# Patient Record
Sex: Male | Born: 1975 | Race: White | Hispanic: No | Marital: Married | Smoking: Former smoker
Health system: Southern US, Community
[De-identification: ages and names within clinical notes are randomized; demographics above are authoritative.]

## PROBLEM LIST (undated history)

## (undated) DIAGNOSIS — G8929 Other chronic pain: Secondary | ICD-10-CM

## (undated) DIAGNOSIS — N281 Cyst of kidney, acquired: Secondary | ICD-10-CM

## (undated) DIAGNOSIS — I2510 Atherosclerotic heart disease of native coronary artery without angina pectoris: Secondary | ICD-10-CM

## (undated) DIAGNOSIS — F429 Obsessive-compulsive disorder, unspecified: Secondary | ICD-10-CM

## (undated) DIAGNOSIS — F419 Anxiety disorder, unspecified: Secondary | ICD-10-CM

## (undated) DIAGNOSIS — J939 Pneumothorax, unspecified: Secondary | ICD-10-CM

## (undated) DIAGNOSIS — M545 Low back pain, unspecified: Secondary | ICD-10-CM

## (undated) DIAGNOSIS — M1990 Unspecified osteoarthritis, unspecified site: Secondary | ICD-10-CM

## (undated) HISTORY — PX: OTHER SURGICAL HISTORY: SHX169

## (undated) HISTORY — DX: Anxiety disorder, unspecified: F41.9

## (undated) HISTORY — DX: Obsessive-compulsive disorder, unspecified: F42.9

## (undated) HISTORY — PX: EYE SURGERY: SHX253

---

## 2016-04-19 ENCOUNTER — Ambulatory Visit: Payer: Self-pay | Admitting: Family Medicine

## 2017-04-22 ENCOUNTER — Emergency Department
Admission: EM | Admit: 2017-04-22 | Discharge: 2017-04-22 | Disposition: A | Discharge status: Home / Self Care | Payer: Self-pay | Attending: Emergency Medicine | Admitting: Emergency Medicine

## 2017-04-22 ENCOUNTER — Other Ambulatory Visit: Payer: Self-pay

## 2017-04-22 DIAGNOSIS — S0501XA Injury of conjunctiva and corneal abrasion without foreign body, right eye, initial encounter: Secondary | ICD-10-CM | POA: Insufficient documentation

## 2017-04-22 DIAGNOSIS — Y998 Other external cause status: Secondary | ICD-10-CM | POA: Insufficient documentation

## 2017-04-22 DIAGNOSIS — F17210 Nicotine dependence, cigarettes, uncomplicated: Secondary | ICD-10-CM | POA: Insufficient documentation

## 2017-04-22 DIAGNOSIS — Y9389 Activity, other specified: Secondary | ICD-10-CM | POA: Insufficient documentation

## 2017-04-22 DIAGNOSIS — Y929 Unspecified place or not applicable: Secondary | ICD-10-CM | POA: Insufficient documentation

## 2017-04-22 DIAGNOSIS — W51XXXA Accidental striking against or bumped into by another person, initial encounter: Secondary | ICD-10-CM | POA: Insufficient documentation

## 2017-04-22 MED ORDER — FLUORESCEIN SODIUM 1 MG OP STRP
1.0000 | ORAL_STRIP | Freq: Once | OPHTHALMIC | Status: DC
  Filled 2017-04-22: qty 1

## 2017-04-22 MED ORDER — KETOROLAC TROMETHAMINE 0.5 % OP SOLN: 1.0000 [drp] | Freq: Four times a day (QID) | OPHTHALMIC | 0 refills | Status: DC

## 2017-04-22 MED ORDER — POLYMYXIN B-TRIMETHOPRIM 10000-0.1 UNIT/ML-% OP SOLN: 2.0000 [drp] | Freq: Four times a day (QID) | OPHTHALMIC | 0 refills | Status: DC

## 2017-04-22 MED ORDER — TETRACAINE HCL 0.5 % OP SOLN
2.0000 [drp] | Freq: Once | OPHTHALMIC | Status: DC
  Filled 2017-04-22: qty 4

## 2017-04-22 NOTE — ED Notes (Signed)
First nurse note: pt complains of "son scratching right eye" about an hour ago. Pt with redness and tearing to r eye.

## 2017-04-22 NOTE — ED Notes (Signed)
Visual acuity: Bilateral: 20/40 Left: 20/40 Right: 20/50

## 2017-04-22 NOTE — ED Provider Notes (Signed)
[facility] Emergency Department Provider Note  ____________________________________________  Time seen: Approximately 11:34 PM  I have reviewed the triage vital signs and the nursing notes.   HISTORY  Chief Complaint Eye Pain    HPI Ralph Lawson is a 42 y.o. male who presents the emergency department complaining of right eye pain.  Patient reports that he was playing with his son when he was accidentally poked in his right eye.  Patient tried over-the-counter eyedrops without any improvement in symptoms.  Patient denies any vision changes, just a foreign body sensation to the right eye.  Patient does not wear glasses or contacts.  Other than over-the-counter eyedrops, no medication for this complaint.  No other complaints at this time.  History reviewed. No pertinent past medical history.  There are no active problems to display for this patient.   Past Surgical History:  Procedure Laterality Date  . collapsed lung      Prior to Admission medications   Medication Sig Start Date End Date Taking? Authorizing Provider  ketorolac (ACULAR) 0.5 % ophthalmic solution Place 1 drop into the right eye 4 (four) times daily. 04/22/17   Cuthriell, Charline Bills, PA-C  trimethoprim-polymyxin b (POLYTRIM) ophthalmic solution Place 2 drops into the right eye every 6 (six) hours. 04/22/17   Cuthriell, Charline Bills, PA-C    Allergies Patient has no known allergies.  No family history on file.  Social History Social History   Tobacco Use  . Smoking status: Current Some Day Smoker    Types: E-cigarettes  . Smokeless tobacco: Never Used  Substance Use Topics  . Alcohol use: Not on file  . Drug use: Not on file     Review of Systems  Constitutional: No fever/chills Eyes: No visual changes. No discharge.  Injury and pain to the right eye. ENT: No upper respiratory complaints. Cardiovascular: no chest pain. Respiratory: no cough. No SOB. Gastrointestinal: No abdominal  pain.  No nausea, no vomiting. Musculoskeletal: Negative for musculoskeletal pain. Skin: Negative for rash, abrasions, lacerations, ecchymosis. Neurological: Negative for headaches, focal weakness or numbness. 10-point ROS otherwise negative.  ____________________________________________   PHYSICAL EXAM:  VITAL SIGNS: ED Triage Vitals  Enc Vitals Group     BP 04/22/17 2044 130/83     Pulse Rate 04/22/17 2044 92     Resp 04/22/17 2044 18     Temp 04/22/17 2044 98.7 F (37.1 C)     Temp Source 04/22/17 2044 Oral     SpO2 04/22/17 2044 99 %     Weight 04/22/17 2045 175 lb (79.4 kg)     Height 04/22/17 2045 6\' 1"  (1.854 m)     Head Circumference --      Peak Flow --      Pain Score 04/22/17 2045 2     Pain Loc --      Pain Edu? --      Excl. in [place]? --      Constitutional: Alert and oriented. Well appearing and in no acute distress. Eyes: Conjunctivae are normal. PERRL. EOMI. funduscopic exam reveals red reflex, vasculature and optic disc bilaterally.  No visible injury or foreign body on funduscopic exam.  Eyes anesthetized using tetracaine drops.  Forcing staining is applied with area of uptake over the superior iris.  No foreign body. Head: Atraumatic. ENT:      Ears:       Nose: No congestion/rhinnorhea.      Mouth/Throat: Mucous membranes are moist.  Neck: No stridor.  Cardiovascular: Normal rate, regular rhythm. Normal S1 and S2.  Good peripheral circulation. Respiratory: Normal respiratory effort without tachypnea or retractions. Lungs CTAB. Good air entry to the bases with no decreased or absent breath sounds. Musculoskeletal: Full range of motion to all extremities. No gross deformities appreciated. Neurologic:  Normal speech and language. No gross focal neurologic deficits are appreciated.  Skin:  Skin is warm, dry and intact. No rash noted. Psychiatric: Mood and affect are normal. Speech and behavior are normal. Patient exhibits appropriate insight and  judgement.   ____________________________________________   LABS (all labs ordered are listed, but only abnormal results are displayed)  Labs Reviewed - No data to display ____________________________________________  EKG   ____________________________________________  RADIOLOGY   No results found.  ____________________________________________    PROCEDURES  Procedure(s) performed:    Procedures    Medications  tetracaine (PONTOCAINE) 0.5 % ophthalmic solution 2 drop (has no administration in time range)  fluorescein ophthalmic strip 1 strip (has no administration in time range)     ____________________________________________   INITIAL IMPRESSION / ASSESSMENT AND PLAN / ED COURSE  Pertinent labs & imaging results that were available during my care of the patient were reviewed by me and considered in my medical decision making (see chart for details).  Review of the [place] CSRS was performed in accordance of the [place] prior to dispensing any controlled drugs.     Patient's diagnosis is consistent with corneal abrasion.  Patient presents emergency department status post injury to the right eye.  Patient reports he was accidentally poked in the right eye with a finger.  No visual changes.  Exam reveals area of uptake consistent with corneal abrasion.. Patient will be discharged home with prescriptions for biotic eyedrops and Acular for symptom control. Patient is to follow up with ophthalmology as needed or otherwise directed. Patient is given ED precautions to return to the ED for any worsening or new symptoms.     ____________________________________________  FINAL CLINICAL IMPRESSION(S) / ED DIAGNOSES  Final diagnoses:  Abrasion of right cornea, initial encounter      NEW MEDICATIONS STARTED DURING THIS VISIT:  ED Discharge Orders        Ordered    trimethoprim-polymyxin b (POLYTRIM) ophthalmic solution  Every 6 hours     04/22/17 2158    ketorolac  (ACULAR) 0.5 % ophthalmic solution  4 times daily     04/22/17 2158          This chart was dictated using voice recognition software/Dragon. Despite best efforts to proofread, errors can occur which can change the meaning. Any change was purely unintentional.    Darletta Moll, PA-C 04/22/17 2336    Lavonia Drafts, MD 04/22/17 2337

## 2017-04-22 NOTE — ED Triage Notes (Signed)
Pt arrives to ED via POV from home with c/o RIGHT eye pain s/p being poked in the eye by his son 2 hrs PTA. Pt denies use of corrective lenses or contacts. Pt is A&O, in NAD; RR even, regular, and unlabored.

## 2018-01-16 ENCOUNTER — Encounter: Payer: Self-pay | Admitting: Medical Oncology

## 2018-01-16 ENCOUNTER — Emergency Department
Admission: EM | Admit: 2018-01-16 | Discharge: 2018-01-16 | Disposition: A | Discharge status: Home / Self Care | Payer: Self-pay | Attending: Emergency Medicine | Admitting: Emergency Medicine

## 2018-01-16 DIAGNOSIS — F17290 Nicotine dependence, other tobacco product, uncomplicated: Secondary | ICD-10-CM | POA: Insufficient documentation

## 2018-01-16 DIAGNOSIS — H44702 Unspecified retained (old) intraocular foreign body, nonmagnetic, left eye: Secondary | ICD-10-CM | POA: Insufficient documentation

## 2018-01-16 DIAGNOSIS — Z79899 Other long term (current) drug therapy: Secondary | ICD-10-CM | POA: Insufficient documentation

## 2018-01-16 DIAGNOSIS — Z189 Retained foreign body fragments, unspecified material: Secondary | ICD-10-CM

## 2018-01-16 DIAGNOSIS — Y939 Activity, unspecified: Secondary | ICD-10-CM | POA: Insufficient documentation

## 2018-01-16 DIAGNOSIS — W208XXA Other cause of strike by thrown, projected or falling object, initial encounter: Secondary | ICD-10-CM | POA: Insufficient documentation

## 2018-01-16 DIAGNOSIS — Y929 Unspecified place or not applicable: Secondary | ICD-10-CM | POA: Insufficient documentation

## 2018-01-16 DIAGNOSIS — Y990 Civilian activity done for income or pay: Secondary | ICD-10-CM | POA: Insufficient documentation

## 2018-01-16 DIAGNOSIS — Z182 Retained plastic fragments: Secondary | ICD-10-CM | POA: Insufficient documentation

## 2018-01-16 MED ORDER — FLUORESCEIN SODIUM 1 MG OP STRP
1.0000 | ORAL_STRIP | Freq: Once | OPHTHALMIC | Status: AC
  Administered 2018-01-16: 1 via OPHTHALMIC
  Filled 2018-01-16: qty 1

## 2018-01-16 MED ORDER — TETRACAINE HCL 0.5 % OP SOLN
2.0000 [drp] | Freq: Once | OPHTHALMIC | Status: AC
  Administered 2018-01-16: 2 [drp] via OPHTHALMIC
  Filled 2018-01-16: qty 4

## 2018-01-16 MED ORDER — ERYTHROMYCIN 5 MG/GM OP OINT: 1.0000 "application " | TOPICAL_OINTMENT | Freq: Four times a day (QID) | OPHTHALMIC | 0 refills | Status: AC

## 2018-01-16 NOTE — Discharge Instructions (Addendum)
Please call Dr. Wallace Going for an appointment. Use the eye ointment as prescribed as well as the pain relieving drop. Return to the ER for symptoms of concern if unable to schedule an appointment.

## 2018-01-16 NOTE — ED Notes (Signed)
See triage note  States he thinks something flew into left eye at work

## 2018-01-16 NOTE — ED Notes (Signed)
Reviewed discharge instructions, follow-up care, and prescriptions with patient. Patient verbalized understanding of all information reviewed. Patient stable, with no distress noted at this time.    

## 2018-01-16 NOTE — ED Provider Notes (Signed)
[facility] Emergency Department Provider Note ____________________________________________  Time seen: Approximately 6:52 PM  I have reviewed the triage vital signs and the nursing notes.   HISTORY  Chief Complaint Foreign Body in Eye   HPI Ralph Lawson is a 43 y.o. male who presents to the emergency department for treatment and evaluation of left eye pain. Plastic flew into his eye last night at work. He tried Acular and Polytrim that he had left from a previous foreign body in the eye. No relief.   History reviewed. No pertinent past medical history.  There are no active problems to display for this patient.   Past Surgical History:  Procedure Laterality Date  . collapsed lung      Prior to Admission medications   Medication Sig Start Date End Date Taking? Authorizing Provider  erythromycin ophthalmic ointment Place 1 application into the left eye 4 (four) times daily for 7 days. 01/16/18 01/23/18  Govind Furey, Dessa Phi, FNP  ketorolac (ACULAR) 0.5 % ophthalmic solution Place 1 drop into the right eye 4 (four) times daily. 04/22/17   Cuthriell, Charline Bills, PA-C  trimethoprim-polymyxin b (POLYTRIM) ophthalmic solution Place 2 drops into the right eye every 6 (six) hours. 04/22/17   Cuthriell, Charline Bills, PA-C    Allergies Patient has no known allergies.  No family history on file.  Social History Social History   Tobacco Use  . Smoking status: Current Some Day Smoker    Types: E-cigarettes  . Smokeless tobacco: Never Used  Substance Use Topics  . Alcohol use: Not on file  . Drug use: Not on file    Review of Systems   Constitutional: No fever/chills Eyes: Positive for visual changes. Positive for pain. Negative for drainage. Musculoskeletal: Negative for pain. Skin: Negative for rash. Neurological: Negative for headaches, focal weakness or numbness. Allergic: Negative for seasonal  allergies. ____________________________________________  PHYSICAL EXAM:  VITAL SIGNS: ED Triage Vitals  Enc Vitals Group     BP 01/16/18 1818 134/79     Pulse Rate 01/16/18 1818 74     Resp 01/16/18 1818 18     Temp 01/16/18 1818 98.4 F (36.9 C)     Temp Source 01/16/18 1818 Oral     SpO2 01/16/18 1818 99 %     Weight 01/16/18 1751 174 lb 2.6 oz (79 kg)     Height --      Head Circumference --      Peak Flow --      Pain Score 01/16/18 1751 4     Pain Loc --      Pain Edu? --      Excl. in [place]? --     Constitutional: Alert and oriented. Well appearing and in no acute distress. Eyes: Visual acuity--see nursing documentation; no globe trauma; Eyelids normal to inspection; Sclera appears anicteric.  Eyelids were inverted. Conjunctiva appears injected and erythematous with a gray-colored foreign body above the iris at approximately 1 o'clock position.  Foreign body measures approximately 2 mm; Cornea intact. Head: Atraumatic. Nose: No congestion/rhinnorhea. Mouth/Throat: Mucous membranes are moist.  Oropharynx non-erythematous. Respiratory: Respirations even and unlabored. Breath sounds clear to auscultation. Musculoskeletal:Normal ROM x 4 extremities. Neurologic:  Normal speech and language. No gross focal neurologic deficits are appreciated. Speech is normal. No gait instability. Skin:  Skin is warm, dry and intact. No rash noted. Psychiatric: Mood and affect are normal. Speech and behavior are normal.  ____________________________________________   LABS (all labs ordered are listed, but only  abnormal results are displayed)  Labs Reviewed - No data to display ____________________________________________  EKG  Not indicated ____________________________________________  RADIOLOGY  Not indicated ____________________________________________   PROCEDURES  Procedure(s) performed: None ____________________________________________   INITIAL IMPRESSION / ASSESSMENT  AND PLAN / ED COURSE  43 year old male presenting to the emergency department for treatment and evaluation of left eye pain.  He states that he believes a piece of plastic flew into his eye last night.  He went to sleep and upon awakening this morning the eye was very irritated and has continued to be so throughout the day.  He has used some medications that were previously prescribed for corneal abrasion without any relief.  An attempt was made to remove the plastic foreign body from the eye without success.  He will be covered with erythromycin ointment and advised to continue using the Acular.  He is to call tomorrow and schedule an appointment with [facility].  He was advised to return to the emergency department for symptoms change or worsen if unable to schedule an appointment.  Pertinent labs & imaging results that were available during my care of the patient were reviewed by me and considered in my medical decision making (see chart for details). ____________________________________________   FINAL CLINICAL IMPRESSION(S) / ED DIAGNOSES  Final diagnoses:  Retained foreign body of left eye    Note:  This document was prepared using Dragon voice recognition software and may include unintentional dictation errors.    Victorino Dike, FNP 01/17/18 6010    Arta Silence, MD 01/17/18 1506

## 2018-01-16 NOTE — ED Triage Notes (Signed)
Pt reports that he was at work and a piece of plastic flew into his left eye.

## 2020-05-20 ENCOUNTER — Ambulatory Visit: Payer: Self-pay | Admitting: *Deleted

## 2020-05-20 ENCOUNTER — Emergency Department
Admission: EM | Admit: 2020-05-20 | Discharge: 2020-05-20 | Disposition: A | Discharge status: Home / Self Care | Payer: BC Managed Care – PPO | Attending: Emergency Medicine | Admitting: Emergency Medicine

## 2020-05-20 ENCOUNTER — Encounter: Payer: Self-pay | Admitting: Emergency Medicine

## 2020-05-20 ENCOUNTER — Other Ambulatory Visit: Payer: Self-pay

## 2020-05-20 ENCOUNTER — Emergency Department: Payer: BC Managed Care – PPO

## 2020-05-20 DIAGNOSIS — F17290 Nicotine dependence, other tobacco product, uncomplicated: Secondary | ICD-10-CM | POA: Diagnosis not present

## 2020-05-20 DIAGNOSIS — M7711 Lateral epicondylitis, right elbow: Secondary | ICD-10-CM | POA: Diagnosis not present

## 2020-05-20 DIAGNOSIS — X500XXA Overexertion from strenuous movement or load, initial encounter: Secondary | ICD-10-CM | POA: Diagnosis not present

## 2020-05-20 DIAGNOSIS — M25521 Pain in right elbow: Secondary | ICD-10-CM | POA: Diagnosis not present

## 2020-05-20 MED ORDER — METHYLPREDNISOLONE 4 MG PO TBPK: ORAL_TABLET | ORAL | 0 refills | Status: DC

## 2020-05-20 MED ORDER — ORPHENADRINE CITRATE ER 100 MG PO TB12: 100.0000 mg | ORAL_TABLET | Freq: Two times a day (BID) | ORAL | 0 refills | Status: DC

## 2020-05-20 NOTE — ED Triage Notes (Signed)
Pt comes with c/o right wrist and pain traveling up his arm. Pt states two days ago he was lifting something and heard a pop/ pt has noticeable swelling to wrist area.

## 2020-05-20 NOTE — Telephone Encounter (Signed)
Patient is calling to report he has injured his elbow- 2 days ago he heard a pop and his elbow has developed weakness, pain and swelling. Advised UC/ED for evaluation. Patient has NP appointment 5/20  Reason for Disposition . Can't move injured elbow normally (i.e., bend or straighten completely)  Answer Assessment - Initial Assessment Questions 1. MECHANISM: "How did the injury happen?"     R elbow- outside of joint- heard pop while working 2. ONSET: "When did the injury happen?" (Minutes or hours ago)      2 days ago 3. LOCATION: "What part of the elbow is the injured?"      outside of the joint 4. APPEARANCE of INJURY: "What does the injury look like?"      puffy 5. SEVERITY: "Can you use the elbow normally?"  "Can you bend it and straighten it fully?"     No- painful- normal movement- with pain 6. SIZE: For cuts, bruises, or swelling, ask: "How large is it?" (e.g., inches or centimeters; entire joint)      Swelling, discoloration 7. PAIN: "Is there pain?" If Yes, ask: "How bad is the pain?"    (Scale 1-10; or mild, moderate, severe)   - NONE (0): no pain.   - MILD (1-3): doesn't interfere with normal activities.   - MODERATE (4-7): interferes with normal activities (e.g., work or school) or awakens from sleep.   - SEVERE (8-10): excruciating pain, unable to do any normal activities, unable to use arm at all.     Yes-severe with pressure to pick up 8. TETANUS: For any breaks in the skin, ask: "When was the last tetanus booster?"     n/a 9. OTHER SYMPTOMS: "Do you have any other symptoms?"  (e.g., numbness in hand)     Shoulder is affected 10. PREGNANCY: "Is there any chance you are pregnant?" "When was your last menstrual period?"       n/a  Protocols used: ELBOW INJURY-A-AH

## 2020-05-20 NOTE — Discharge Instructions (Signed)
Read and follow discharge care instruction.  Purchase an over-the-counter elbow strap.  Take medication as directed.  Follow-up with orthopedics if no improvement in 1 week.

## 2020-05-20 NOTE — ED Notes (Signed)
See triage note  Presents with pain to right elbow area  States he was lifting something couple of days ago  [place] a pop   Had some pain with some swelling to right wrist  Now states pain is moving up into elbow area  Good pulses

## 2020-05-20 NOTE — ED Provider Notes (Signed)
[facility] Provider Note   ____________________________________________   Event Date/Time   First MD Initiated Contact with Patient 05/20/20 [phone]     (approximate)  I have reviewed the triage vital signs and the nursing notes.   HISTORY  Chief Complaint Wrist Pain    HPI Ralph Lawson is a 45 y.o. male patient complaining of right lateral elbow pain for 2 days status post lifting incident.  Patient states he heard a "pop in his elbow".  Patient states since he has been pain increased with pronation and supination.  Also has increased pain with flexion.  Patient is right-hand dominant.  Patient rates the pain as a constant 4/10.  Patient states pain increases to a 7/10 with movement.         History reviewed. No pertinent past medical history.  There are no problems to display for this patient.   Past Surgical History:  Procedure Laterality Date  . collapsed lung      Prior to Admission medications   Medication Sig Start Date End Date Taking? Authorizing Provider  methylPREDNISolone (MEDROL DOSEPAK) 4 MG TBPK tablet Take Tapered dose as directed 05/20/20  Yes Sable Feil, PA-C  orphenadrine (NORFLEX) 100 MG tablet Take 1 tablet (100 mg total) by mouth 2 (two) times daily. 05/20/20  Yes Sable Feil, PA-C    Allergies Patient has no known allergies.  No family history on file.  Social History Social History   Tobacco Use  . Smoking status: Current Some Day Smoker    Types: E-cigarettes  . Smokeless tobacco: Never Used    Review of Systems  Constitutional: No fever/chills Eyes: No visual changes. ENT: No sore throat. Cardiovascular: Denies chest pain. Respiratory: Denies shortness of breath. Gastrointestinal: No abdominal pain.  No nausea, no vomiting.  No diarrhea.  No constipation. Genitourinary: Negative for dysuria. Musculoskeletal right elbow pain.   Skin: Negative for rash. Neurological: Negative  for headaches, focal weakness or numbness.   ____________________________________________   PHYSICAL EXAM:  VITAL SIGNS: ED Triage Vitals  Enc Vitals Group     BP 05/20/20 0949 (!) 142/101     Pulse Rate 05/20/20 0949 (!) 107     Resp --      Temp 05/20/20 0949 97.9 F (36.6 C)     Temp src --      SpO2 05/20/20 0949 100 %     Weight 05/20/20 0947 185 lb (83.9 kg)     Height 05/20/20 0947 6\' 1"  (1.854 m)     Head Circumference --      Peak Flow --      Pain Score 05/20/20 0948 4     Pain Loc --      Pain Edu? --      Excl. in [place]? --    Constitutional: Alert and oriented. Well appearing and in no acute distress. Cardiovascular: Normal rate, regular rhythm. Grossly normal heart sounds.  Good peripheral circulation. Respiratory: Normal respiratory effort.  No retractions. Lungs CTAB. Musculoskeletal: No obvious deformity to the right elbow.  Patient has full and equal range of motion to grimace of pain.Marland Kitchen Neurologic:  Normal speech and language. No gross focal neurologic deficits are appreciated. No gait instability. Skin:  Skin is warm, dry and intact. No rash noted. Psychiatric: Mood and affect are normal. Speech and behavior are normal.  ____________________________________________   LABS (all labs ordered are listed, but only abnormal results are displayed)  Labs Reviewed - No data  to display ____________________________________________  EKG   ____________________________________________  RADIOLOGY I, Sable Feil, personally viewed and evaluated these images (plain radiographs) as part of my medical decision making, as well as reviewing the written report by the radiologist.  ED MD interpretation: No acute bony abnormality found on x-ray of the right elbow.  Official radiology report(s): DG Elbow Complete Right  Result Date: 05/20/2020 CLINICAL DATA:  Right elbow pain status post lifting incident EXAM: RIGHT ELBOW - COMPLETE 3+ VIEW COMPARISON:  None.  FINDINGS: There is no evidence of fracture, dislocation, or joint effusion. There is no evidence of arthropathy or other focal bone abnormality. Soft tissues are unremarkable. IMPRESSION: Negative. Electronically Signed   By: Miachel Roux M.D.   On: 05/20/2020 10:44    ____________________________________________   PROCEDURES  Procedure(s) performed (including Critical Care):  Procedures   ____________________________________________   INITIAL IMPRESSION / ASSESSMENT AND PLAN / ED COURSE  As part of my medical decision making, I reviewed the following data within the [place]         Patient presents with 2 days of increasing right lateral elbow pain.  Discussed no acute findings on x-ray of the right elbow.  Patient complaining physical exam consistent with lateral right epicondylitis.  Patient given discharge care instructions and advised take medication as directed.  Advised if no improvement in 1 week to follow-up orthopedics.      ____________________________________________   FINAL CLINICAL IMPRESSION(S) / ED DIAGNOSES  Final diagnoses:  Epicondylitis, lateral, right     ED Discharge Orders         Ordered    methylPREDNISolone (MEDROL DOSEPAK) 4 MG TBPK tablet        05/20/20 1106    orphenadrine (NORFLEX) 100 MG tablet  2 times daily        05/20/20 1106          *Please note:  [facility] was evaluated in Emergency Department on 05/20/2020 for the symptoms described in the history of present illness. He was evaluated in the context of the global COVID-19 pandemic, which necessitated consideration that the patient might be at risk for infection with the SARS-CoV-2 virus that causes COVID-19. Institutional protocols and algorithms that pertain to the evaluation of patients at risk for COVID-19 are in a state of rapid change based on information released by regulatory bodies including the CDC and federal and state organizations. These policies  and algorithms were followed during the patient's care in the ED.  Some ED evaluations and interventions may be delayed as a result of limited staffing during and the pandemic.*   Note:  This document was prepared using Dragon voice recognition software and may include unintentional dictation errors.    Sable Feil, PA-C 05/20/20 1111    Vladimir Crofts, MD 05/20/20 [phone]

## 2020-05-22 ENCOUNTER — Telehealth: Payer: Self-pay

## 2020-05-22 NOTE — Telephone Encounter (Signed)
Recommend patient go to [facility] in [place] for evaluation.

## 2020-05-22 NOTE — Telephone Encounter (Signed)
Called patient no answer left detailed message with provider advise.

## 2020-05-22 NOTE — Telephone Encounter (Signed)
Please see triage note

## 2020-05-22 NOTE — Telephone Encounter (Signed)
Copied from [facility] [id number]. Topic: Appointment Scheduling - Scheduling Inquiry for Clinic >> May 20, 2020  8:33 AM Oneta Rack wrote: Reason for CRM, Any cancellations for a New Patient prior to 05/29/2020 please reach out to the patient. Patient has [employer] option and was sent to nurse triage due to loss of movement in right arm

## 2020-05-28 NOTE — Progress Notes (Signed)
BP (!) 142/82   Pulse (!) 101   Temp 98.4 F (36.9 C)   Ht 6' 1.58" (1.869 m)   Wt 184 lb (83.5 kg)   SpO2 99%   BMI 23.89 kg/m    Subjective:    Patient ID: Ralph Lawson, male    DOB: 02-14-1975, 45 y.o.   MRN: [id number]  HPI: Ralph Lawson is a 45 y.o. male  Chief Complaint  Patient presents with  . Establish Care  . Nicotine Dependence  . OCD    Was on Citalopram in the past with klonopin  . Arm Pain    Right elbow  . Foot Pain    Left foot, front pad going out toward toes   Patient presents to clinic to establish care with new PCP.  Patient reports a history of OCD, Current everyday smoker, anxiety, uses marijuana and drinks about 24 cans of beer daily, and had a pneumothorax around 1999.  Patient denies a history of: Hypertension, Elevated Cholesterol, Thyroid problems, Depression, Neurological problems, and Abdominal problems.   Patient has had an eye surgery many years ago.    RIGHT ARM PAIN Patient states he went to the hospital for right elbow pain.  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 He felt something hurt in his arm.  Then when he went to pick his son up he noticed the pain again and since then the pain has been worse.  He has more limited rotation than normal.  States he couldn't turn the screw driver.   LEFT FOOT PAIN Patient states he is having pain from the base of his toes to the tips of his toes.  It has been going on for about 1 year.  Patient states that he can walk on the treadmill for about 3 miles and doesn't have pain.  Patient states it feels more like a nerve pain then a muscle pain.  Patient states he has pain when he walks around at home.  Hurts in different shoes.    SMOKING CESSATION Smoking Status: current everyday smoker Smoking Amount: 1PPD Smoking Onset: 45 years old Smoking Quit Date: Unknown Smoking triggers:  Type of tobacco use: cigarettes  Children in the house: yes Other household members who smoke: no Treatments attempted: Wellbutrin,  patches Pneumovax:   OCD Patient has been on Citalopram in the past which worked well for him.  Would like to go back on the Citalopram.  Has not been on medication in several years.    Relevant past medical, surgical, family and social history reviewed and updated as indicated. Interim medical history since our last visit reviewed. Allergies and medications reviewed and updated.  Review of Systems  Eyes: Negative for visual disturbance.  Respiratory: Negative for shortness of breath.   Cardiovascular: Negative for chest pain and leg swelling.  Musculoskeletal:       Right elbow pain.  Left foot pain.  Neurological: Negative for light-headedness and headaches.    Per HPI unless specifically indicated above     Objective:    BP (!) 142/82   Pulse (!) 101   Temp 98.4 F (36.9 C)   Ht 6' 1.58" (1.869 m)   Wt 184 lb (83.5 kg)   SpO2 99%   BMI 23.89 kg/m   Wt Readings from Last 3 Encounters:  05/29/20 184 lb (83.5 kg)  05/20/20 185 lb (83.9 kg)  01/16/18 174 lb 2.6 oz (79 kg)    Physical Exam Vitals and nursing note reviewed.  Constitutional:  General: He is not in acute distress.    Appearance: Normal appearance. He is not ill-appearing, toxic-appearing or diaphoretic.  HENT:     Head: Normocephalic.     Right Ear: External ear normal.     Left Ear: External ear normal.     Nose: Nose normal. No congestion or rhinorrhea.     Mouth/Throat:     Mouth: Mucous membranes are moist.  Eyes:     General:        Right eye: No discharge.        Left eye: No discharge.     Extraocular Movements: Extraocular movements intact.     Conjunctiva/sclera: Conjunctivae normal.     Pupils: Pupils are equal, round, and reactive to light.  Cardiovascular:     Rate and Rhythm: Normal rate and regular rhythm.     Heart sounds: No murmur heard.   Pulmonary:     Effort: Pulmonary effort is normal. No respiratory distress.     Breath sounds: Normal breath sounds. No wheezing,  rhonchi or rales.  Abdominal:     General: Abdomen is flat. Bowel sounds are normal.  Musculoskeletal:     Right elbow: No swelling, deformity, effusion or lacerations. Decreased range of motion. No tenderness.       Arms:     Cervical back: Normal range of motion and neck supple.  Skin:    General: Skin is warm and dry.     Capillary Refill: Capillary refill takes less than 2 seconds.  Neurological:     General: No focal deficit present.     Mental Status: He is alert and oriented to person, place, and time.  Psychiatric:        Mood and Affect: Mood normal.        Behavior: Behavior normal.        Thought Content: Thought content normal.        Judgment: Judgment normal.     No results found for this or any previous visit.    Assessment & Plan:   Problem List Items Addressed This Visit      Other   OCD (obsessive compulsive disorder)    Will restart Citalopram at 10mg . Patient was previously on 20mg .  Will start at low dose and titrate up as patient tolerates.  Side effects and benefits of medication discussed with patient during visit.  Follow up in 1 month for reevaluation.      Relevant Medications   citalopram (CELEXA) 10 MG tablet   Tobacco abuse    Smoking cessation discussed during visit.  Patient given Chantix.  Side effects and benefits discussed during visit.  Discussed proper use of the medication.  Pick a day next week to quit smoking. Follow up in 1 month for reevaluation.       Other Visit Diagnoses    Right elbow pain    -  Primary   Recommend evaluation with Orthopedics.    Relevant Orders   Ambulatory referral to Orthopedics   Elevated blood pressure reading       Never diagnosed with HTN. Will recheck in one month and discuss medication if necessary at that time.    Encounter to establish care       Immunization due       Relevant Orders   Tdap vaccine greater than or equal to 7yo IM (Completed)   Pneumococcal polysaccharide vaccine 23-valent  greater than or equal to 2yo subcutaneous/IM (Completed)  Follow up plan: Return in about 1 month (around 06/29/2020) for Physical and Fasting labs.

## 2020-05-29 ENCOUNTER — Encounter: Payer: Self-pay | Admitting: Nurse Practitioner

## 2020-05-29 ENCOUNTER — Other Ambulatory Visit: Payer: Self-pay

## 2020-05-29 ENCOUNTER — Ambulatory Visit: Payer: BC Managed Care – PPO | Admitting: Nurse Practitioner

## 2020-05-29 VITALS — BP 142/82 | HR 101 | Temp 98.4°F | Ht 73.58 in | Wt 184.0 lb

## 2020-05-29 DIAGNOSIS — R030 Elevated blood-pressure reading, without diagnosis of hypertension: Secondary | ICD-10-CM

## 2020-05-29 DIAGNOSIS — Z23 Encounter for immunization: Secondary | ICD-10-CM | POA: Diagnosis not present

## 2020-05-29 DIAGNOSIS — M25521 Pain in right elbow: Secondary | ICD-10-CM | POA: Diagnosis not present

## 2020-05-29 DIAGNOSIS — F429 Obsessive-compulsive disorder, unspecified: Secondary | ICD-10-CM

## 2020-05-29 DIAGNOSIS — Z720 Tobacco use: Secondary | ICD-10-CM

## 2020-05-29 DIAGNOSIS — Z7689 Persons encountering health services in other specified circumstances: Secondary | ICD-10-CM

## 2020-05-29 MED ORDER — CITALOPRAM HYDROBROMIDE 10 MG PO TABS: 10.0000 mg | ORAL_TABLET | Freq: Every day | ORAL | 0 refills | Status: DC

## 2020-05-29 MED ORDER — CHANTIX STARTING MONTH PAK 0.5 MG X 11 & 1 MG X 42 PO TABS
ORAL_TABLET | ORAL | 0 refills | Status: DC
Start: 2020-05-29 — End: 2020-05-29

## 2020-05-29 MED ORDER — VARENICLINE TARTRATE 0.5 MG PO TABS: 0.5000 mg | ORAL_TABLET | Freq: Two times a day (BID) | ORAL | 0 refills | Status: DC

## 2020-05-29 NOTE — Assessment & Plan Note (Signed)
Smoking cessation discussed during visit.  Patient given Chantix.  Side effects and benefits discussed during visit.  Discussed proper use of the medication.  Pick a day next week to quit smoking. Follow up in 1 month for reevaluation.

## 2020-05-29 NOTE — Addendum Note (Signed)
Addended by: Jon Billings on: 05/29/2020 04:37 PM   Modules accepted: Orders

## 2020-05-29 NOTE — Assessment & Plan Note (Signed)
Will restart Citalopram at 10mg . Patient was previously on 20mg .  Will start at low dose and titrate up as patient tolerates.  Side effects and benefits of medication discussed with patient during visit.  Follow up in 1 month for reevaluation.

## 2020-07-07 NOTE — Progress Notes (Signed)
BP 120/77   Pulse 94   Temp 98.7 F (37.1 C) (Oral)   Ht 6' 1.7" (1.872 m)   Wt 186 lb 9.6 oz (84.6 kg)   SpO2 98%   BMI 24.15 kg/m    Subjective:    Patient ID: Ralph Lawson, male    DOB: 05-19-1975, 45 y.o.   MRN: [id number]  HPI: Ralph Lawson is a 45 y.o. male presenting on 07/08/2020 for comprehensive medical examination. Current medical complaints include: OCD and Smoking Cessation  He currently lives with: Interim Problems from his last visit: no  SMOKING CESSATION Patient has not stopped smoking yet.  Patient will be back from the beach on [place] 13-14 and plans to stop smoking at that time.  OCD Patient states he is taking the Citalopram for his OCD.  Patient is currently on 10mg  of citalopram and he would like to increase to 20mg  which is was he was previously on.  He states the obsessive thoughts have improved since being on the Citalopram.     Denies HA, CP, SOB, dizziness, palpitations, visual changes, and lower extremity swelling.  GAD 7 : Generalized Anxiety Score 07/08/2020  Nervous, Anxious, on Edge 3  Control/stop worrying 1  Worry too much - different things 0  Trouble relaxing 0  Restless 0  Easily annoyed or irritable 1  Afraid - awful might happen 0  Total GAD 7 Score 5  Anxiety Difficulty Not difficult at all    Depression Screen done today and results listed below:  Depression screen [facility] 2/9 07/08/2020 05/29/2020  Decreased Interest 0 0  Down, Depressed, Hopeless 0 0  PHQ - 2 Score 0 0  Altered sleeping 1 -  Tired, decreased energy 1 -  Change in appetite 0 -  Feeling bad or failure about yourself  0 -  Trouble concentrating 0 -  Moving slowly or fidgety/restless 0 -  Suicidal thoughts 0 -  PHQ-9 Score 2 -  Difficult doing work/chores Not difficult at all -    The patient does not have a history of falls. I did complete a risk assessment for falls. A plan of care for falls was documented.   Past Medical History:  Past Medical History:   Diagnosis Date   OCD (obsessive compulsive disorder)     Surgical History:  Past Surgical History:  Procedure Laterality Date   collapsed lung     EYE SURGERY     R K Surgery    Medications:  Current Outpatient Medications on File Prior to Visit  Medication Sig   varenicline (CHANTIX) 0.5 MG tablet Take 1 tablet (0.5 mg total) by mouth 2 (two) times daily. Take one 0.5 mg tablet by mouth once daily for 3 days, then increase to one 0.5 mg tablet twice daily for 4 days, then increase to one 1 mg tablet twice daily. (Patient not taking: Reported on 07/08/2020)   No current facility-administered medications on file prior to visit.    Allergies:  No Known Allergies  Social History:  Social History   Socioeconomic History   Marital status: Married    Spouse name: Not on file   Number of children: Not on file   Years of education: Not on file   Highest education level: Not on file  Occupational History   Not on file  Tobacco Use   Smoking status: Some Days    Packs/day: 1.00    Years: 30.00    Pack years: 30.00    Types: E-cigarettes,  Cigarettes    Start date: 03/30/1990   Smokeless tobacco: Never  Vaping Use   Vaping Use: Some days  Substance and Sexual Activity   Alcohol use: Yes    Alcohol/week: 24.0 standard drinks    Types: 24 Cans of beer per week   Drug use: Yes    Types: Marijuana   Sexual activity: Yes    Birth control/protection: None  Other Topics Concern   Not on file  Social History Narrative   Not on file   Social Determinants of Health   Financial Resource Strain: Not on file  Food Insecurity: Not on file  Transportation Needs: Not on file  Physical Activity: Not on file  Stress: Not on file  Social Connections: Not on file  Intimate Partner Violence: Not on file   Social History   Tobacco Use  Smoking Status Some Days   Packs/day: 1.00   Years: 30.00   Pack years: 30.00   Types: E-cigarettes, Cigarettes   Start date: 03/30/1990   Smokeless Tobacco Never   Social History   Substance and Sexual Activity  Alcohol Use Yes   Alcohol/week: 24.0 standard drinks   Types: 24 Cans of beer per week    Family History:  Family History  Problem Relation Age of Onset   Cancer Mother        Breast   Cancer Father        Skin   Autism Son    Dementia Maternal Grandmother    Cancer Maternal Grandfather        Colon   Alzheimer's disease Maternal Grandfather    Alcohol abuse Maternal Grandfather    Hypertension Paternal Grandmother    Hypertension Paternal Grandfather    Diabetes Paternal Grandfather     Past medical history, surgical history, medications, allergies, family history and social history reviewed with patient today and changes made to appropriate areas of the chart.   Review of Systems  Eyes:  Negative for blurred vision and double vision.  Respiratory:  Negative for shortness of breath.   Cardiovascular:  Negative for chest pain, palpitations and leg swelling.  Neurological:  Negative for dizziness and headaches.  Psychiatric/Behavioral:  The patient is nervous/anxious.        Some OCD thoughts but improving.  All other ROS negative except what is listed above and in the HPI.      Objective:    BP 120/77   Pulse 94   Temp 98.7 F (37.1 C) (Oral)   Ht 6' 1.7" (1.872 m)   Wt 186 lb 9.6 oz (84.6 kg)   SpO2 98%   BMI 24.15 kg/m   Wt Readings from Last 3 Encounters:  07/08/20 186 lb 9.6 oz (84.6 kg)  05/29/20 184 lb (83.5 kg)  05/20/20 185 lb (83.9 kg)    Physical Exam Vitals and nursing note reviewed.  Constitutional:      General: He is not in acute distress.    Appearance: Normal appearance. He is not ill-appearing, toxic-appearing or diaphoretic.  HENT:     Head: Normocephalic.     Right Ear: Tympanic membrane, ear canal and external ear normal.     Left Ear: Tympanic membrane, ear canal and external ear normal.     Nose: Nose normal. No congestion or rhinorrhea.     Mouth/Throat:      Mouth: Mucous membranes are moist.  Eyes:     General:        Right eye: No discharge.  Left eye: No discharge.     Extraocular Movements: Extraocular movements intact.     Conjunctiva/sclera: Conjunctivae normal.     Pupils: Pupils are equal, round, and reactive to light.  Cardiovascular:     Rate and Rhythm: Normal rate and regular rhythm.     Heart sounds: No murmur heard. Pulmonary:     Effort: Pulmonary effort is normal. No respiratory distress.     Breath sounds: Normal breath sounds. No wheezing, rhonchi or rales.  Abdominal:     General: Abdomen is flat. Bowel sounds are normal. There is no distension.     Palpations: Abdomen is soft.     Tenderness: There is no abdominal tenderness. There is no guarding.  Musculoskeletal:     Cervical back: Normal range of motion and neck supple.  Skin:    General: Skin is warm and dry.     Capillary Refill: Capillary refill takes less than 2 seconds.  Neurological:     General: No focal deficit present.     Mental Status: He is alert and oriented to person, place, and time.     Cranial Nerves: No cranial nerve deficit.     Motor: No weakness.     Deep Tendon Reflexes: Reflexes normal.  Psychiatric:        Mood and Affect: Mood normal.        Behavior: Behavior normal.        Thought Content: Thought content normal.        Judgment: Judgment normal.    No results found for this or any previous visit.    Assessment & Plan:   Problem List Items Addressed This Visit       Other   OCD (obsessive compulsive disorder)    Chronic.  Improving. Increased Celexa to 20mg  daily.  Follow up in 3 months for reevaluation.  Call sooner if concerns arise.        Relevant Medications   citalopram (CELEXA) 20 MG tablet   Tobacco abuse    Chronic.  Plans to quit smoking in July around the 13-14. Reviewed how to use patch and picking a day after starting the patch to quit smoking.  Follow up in 3 months for reevaluation.        Other Visit Diagnoses     Annual physical exam    -  Primary   Health maintenance reviewed during visit today. Up to date on Vaccines. Labs ordered.   Relevant Orders   TSH   PSA   Lipid panel   CBC with Differential/Platelet   Comprehensive metabolic panel   Urinalysis, Routine w reflex microscopic   Encounter for hepatitis C screening test for low risk patient       Relevant Orders   Hepatitis C Antibody   Screening for HIV (human immunodeficiency virus)       Relevant Orders   Hepatitis C Antibody   HIV Antibody (routine testing w rflx)        Discussed aspirin prophylaxis for myocardial infarction prevention and decision was it was not indicated  LABORATORY TESTING:  Health maintenance labs ordered today as discussed above.   The natural history of prostate cancer and ongoing controversy regarding screening and potential treatment outcomes of prostate cancer has been discussed with the patient. The meaning of a false positive PSA and a false negative PSA has been discussed. He indicates understanding of the limitations of this screening test and wishes to proceed with screening PSA testing.  IMMUNIZATIONS:   - Tdap: Tetanus vaccination status reviewed: last tetanus booster within 10 years. - Influenza: postponed to flu season - Pneumovax: Not applicable - Prevnar: Not applicable - HPV: Given elsewhere - Zostavax vaccine: Not applicable  SCREENING: - Colonoscopy: Not applicable  Discussed with patient purpose of the colonoscopy is to detect colon cancer at curable precancerous or early stages   - AAA Screening: Not applicable  -Hearing Test: Not applicable  -Spirometry: Not applicable   PATIENT COUNSELING:    Sexuality: Discussed sexually transmitted diseases, partner selection, use of condoms, avoidance of unintended pregnancy  and contraceptive alternatives.   Advised to avoid cigarette smoking.  I discussed with the patient that most people either  abstain from alcohol or drink within safe limits (<=14/week and <=4 drinks/occasion for males, <=7/weeks and <= 3 drinks/occasion for females) and that the risk for alcohol disorders and other health effects rises proportionally with the number of drinks per week and how often a drinker exceeds daily limits.  Discussed cessation/primary prevention of drug use and availability of treatment for abuse.   Diet: Encouraged to adjust caloric intake to maintain  or achieve ideal body weight, to reduce intake of dietary saturated fat and total fat, to limit sodium intake by avoiding high sodium foods and not adding table salt, and to maintain adequate dietary potassium and calcium preferably from fresh fruits, vegetables, and low-fat dairy products.    stressed the importance of regular exercise  Injury prevention: Discussed safety belts, safety helmets, smoke detector, smoking near bedding or upholstery.   Dental health: Discussed importance of regular tooth brushing, flossing, and dental visits.   Follow up plan: NEXT PREVENTATIVE PHYSICAL DUE IN 1 YEAR. Return in about 3 months (around 10/08/2020) for OCD and Tobacco Cessation.

## 2020-07-08 ENCOUNTER — Ambulatory Visit (INDEPENDENT_AMBULATORY_CARE_PROVIDER_SITE_OTHER): Payer: BC Managed Care – PPO | Admitting: Nurse Practitioner

## 2020-07-08 ENCOUNTER — Other Ambulatory Visit: Payer: Self-pay

## 2020-07-08 ENCOUNTER — Encounter: Payer: Self-pay | Admitting: Nurse Practitioner

## 2020-07-08 VITALS — BP 120/77 | HR 94 | Temp 98.7°F | Ht 73.7 in | Wt 186.6 lb

## 2020-07-08 DIAGNOSIS — Z0000 Encounter for general adult medical examination without abnormal findings: Secondary | ICD-10-CM | POA: Diagnosis not present

## 2020-07-08 DIAGNOSIS — Z1159 Encounter for screening for other viral diseases: Secondary | ICD-10-CM

## 2020-07-08 DIAGNOSIS — Z720 Tobacco use: Secondary | ICD-10-CM

## 2020-07-08 DIAGNOSIS — F429 Obsessive-compulsive disorder, unspecified: Secondary | ICD-10-CM

## 2020-07-08 DIAGNOSIS — Z114 Encounter for screening for human immunodeficiency virus [HIV]: Secondary | ICD-10-CM

## 2020-07-08 LAB — URINALYSIS, ROUTINE W REFLEX MICROSCOPIC
Bilirubin, UA: NEGATIVE
Glucose, UA: NEGATIVE
Ketones, UA: NEGATIVE
Leukocytes,UA: NEGATIVE
Nitrite, UA: NEGATIVE
Protein,UA: NEGATIVE
Specific Gravity, UA: 1.015 (ref 1.005–1.030)
Urobilinogen, Ur: 0.2 mg/dL (ref 0.2–1.0)
pH, UA: 7 (ref 5.0–7.5)

## 2020-07-08 LAB — MICROSCOPIC EXAMINATION
Bacteria, UA: NONE SEEN
WBC, UA: NONE SEEN /hpf (ref 0–5)

## 2020-07-08 MED ORDER — CITALOPRAM HYDROBROMIDE 20 MG PO TABS: 20.0000 mg | ORAL_TABLET | Freq: Every day | ORAL | 1 refills | Status: DC

## 2020-07-08 NOTE — Assessment & Plan Note (Signed)
Chronic.  Plans to quit smoking in July around the 13-14. Reviewed how to use patch and picking a day after starting the patch to quit smoking.  Follow up in 3 months for reevaluation.

## 2020-07-08 NOTE — Assessment & Plan Note (Signed)
Chronic.  Improving. Increased Celexa to 20mg  daily.  Follow up in 3 months for reevaluation.  Call sooner if concerns arise.

## 2020-07-09 LAB — CBC WITH DIFFERENTIAL/PLATELET
Basophils Absolute: 0.1 10*3/uL (ref 0.0–0.2)
Basos: 1 %
EOS (ABSOLUTE): 0.1 10*3/uL (ref 0.0–0.4)
Eos: 1 %
Hematocrit: 48 % (ref 37.5–51.0)
Hemoglobin: 16.1 g/dL (ref 13.0–17.7)
Immature Grans (Abs): 0 10*3/uL (ref 0.0–0.1)
Immature Granulocytes: 0 %
Lymphocytes Absolute: 2.2 10*3/uL (ref 0.7–3.1)
Lymphs: 24 %
MCH: 31.1 pg (ref 26.6–33.0)
MCHC: 33.5 g/dL (ref 31.5–35.7)
MCV: 93 fL (ref 79–97)
Monocytes Absolute: 0.7 10*3/uL (ref 0.1–0.9)
Monocytes: 8 %
Neutrophils Absolute: 6 10*3/uL (ref 1.4–7.0)
Neutrophils: 66 %
Platelets: 416 10*3/uL (ref 150–450)
RBC: 5.18 x10E6/uL (ref 4.14–5.80)
RDW: 12.5 % (ref 11.6–15.4)
WBC: 8.9 10*3/uL (ref 3.4–10.8)

## 2020-07-09 LAB — COMPREHENSIVE METABOLIC PANEL
ALT: 22 IU/L (ref 0–44)
AST: 21 IU/L (ref 0–40)
Albumin/Globulin Ratio: 2 (ref 1.2–2.2)
Albumin: 5.1 g/dL — ABNORMAL HIGH (ref 4.0–5.0)
Alkaline Phosphatase: 76 IU/L (ref 44–121)
BUN/Creatinine Ratio: 14 (ref 9–20)
BUN: 9 mg/dL (ref 6–24)
Bilirubin Total: 0.5 mg/dL (ref 0.0–1.2)
CO2: 25 mmol/L (ref 20–29)
Calcium: 9.6 mg/dL (ref 8.7–10.2)
Chloride: 98 mmol/L (ref 96–106)
Creatinine, Ser: 0.64 mg/dL — ABNORMAL LOW (ref 0.76–1.27)
Globulin, Total: 2.6 g/dL (ref 1.5–4.5)
Glucose: 99 mg/dL (ref 65–99)
Potassium: 4.5 mmol/L (ref 3.5–5.2)
Sodium: 140 mmol/L (ref 134–144)
Total Protein: 7.7 g/dL (ref 6.0–8.5)
eGFR: 120 mL/min/{1.73_m2} (ref >59)

## 2020-07-09 LAB — PSA: Prostate Specific Ag, Serum: 1.4 ng/mL (ref 0.0–4.0)

## 2020-07-09 LAB — LIPID PANEL
Chol/HDL Ratio: 3.8 ratio (ref 0.0–5.0)
Cholesterol, Total: 242 mg/dL — ABNORMAL HIGH (ref 100–199)
HDL: 64 mg/dL (ref >39)
LDL Chol Calc (NIH): 159 mg/dL — ABNORMAL HIGH (ref 0–99)
Triglycerides: 106 mg/dL (ref 0–149)
VLDL Cholesterol Cal: 19 mg/dL (ref 5–40)

## 2020-07-09 LAB — HIV ANTIBODY (ROUTINE TESTING W REFLEX): HIV Screen 4th Generation wRfx: NONREACTIVE

## 2020-07-09 LAB — TSH: TSH: 1.81 u[IU]/mL (ref 0.450–4.500)

## 2020-07-09 LAB — HEPATITIS C ANTIBODY: Hep C Virus Ab: <0.1 s/co ratio (ref 0.0–0.9)

## 2020-07-09 NOTE — Progress Notes (Signed)
Hi Ralph Lawson.  It ws great to see you.  Your lab work shows that your thyroid, PSA, Complete blood count, hepatitis c, HIV, liver, kidneys and electrolytes all look great.   Your urinalysis shows that there is some blood.  We will continue to monitor this at future visits. Your cardiac risk score shows that you are at moderate risk of cardiac event over the next 10 years. Due to this, I recommend you start Crestor 5mg  once daily.  If you agree I will send this to the pharmacy for you. Please let me know if you have any questions.   Your cholesterol is elevated. The 10-year ASCVD risk score Ralph Lawson DC Brooke Bonito., et al., 2013) is: 4.6%   Values used to calculate the score:     Age: 45 years     Sex: Male     Is Non-Hispanic African American: No     Diabetic: No     Tobacco smoker: Yes     Systolic Blood Pressure: 053 mmHg     Is BP treated: No     HDL Cholesterol: 64 mg/dL     Total Cholesterol: 242 mg/dL

## 2020-07-10 ENCOUNTER — Other Ambulatory Visit: Payer: Self-pay | Admitting: Family Medicine

## 2020-07-10 ENCOUNTER — Other Ambulatory Visit: Payer: Self-pay | Admitting: Nurse Practitioner

## 2020-07-10 MED ORDER — ROSUVASTATIN CALCIUM 5 MG PO TABS: 5.0000 mg | ORAL_TABLET | Freq: Every day | ORAL | 1 refills | Status: DC

## 2020-10-07 NOTE — Progress Notes (Signed)
BP (!) 100/58   Pulse 80   Temp 99 F (37.2 C) (Oral)   Ht _0  (1.854 m)   Wt 183 lb 9.6 oz (83.3 kg)   SpO2 100%   BMI 24.22 kg/m    Subjective:    Patient ID: Ralph Lawson, male    DOB: 04-26-1975, 45 y.o.   MRN: [id number]  HPI: [name] is a 45 y.o. male  Chief Complaint  Patient presents with   Hyperlipidemia   OCD Patient states his OCD is doing well.  Feels like this is a good dose for him at this time.  Denies concerns regarding his OCD and medication.    SMOKING CESSATION Smoking Status: still smoking Smoking Amount: Smoking Onset:  Smoking Quit Date: plans to quit cold [place]. States the medication did not make him feel well.  Smoking triggers: drinking.  Stopped smoking for 2 days then had a beer and started smoking again.   Patient states he has had low energy and concerned that he may have low testosterone.  Wondering what can be done to increase his energy levels.    Relevant past medical, surgical, family and social history reviewed and updated as indicated. Interim medical history since our last visit reviewed. Allergies and medications reviewed and updated.  Review of Systems  Constitutional:  Positive for fatigue.  Psychiatric/Behavioral:         OCD is well controlled.    Per HPI unless specifically indicated above     Objective:    BP (!) 100/58   Pulse 80   Temp 99 F (37.2 C) (Oral)   Ht _1  (1.854 m)   Wt 183 lb 9.6 oz (83.3 kg)   SpO2 100%   BMI 24.22 kg/m   Wt Readings from Last 3 Encounters:  10/08/20 183 lb 9.6 oz (83.3 kg)  07/08/20 186 lb 9.6 oz (84.6 kg)  05/29/20 184 lb (83.5 kg)    Physical Exam Vitals and nursing note reviewed.  Constitutional:      General: He is not in acute distress.    Appearance: Normal appearance. He is not ill-appearing, toxic-appearing or diaphoretic.  HENT:     Head: Normocephalic.     Right Ear: External ear normal.     Left Ear: External ear normal.     Nose: Nose normal. No  congestion or rhinorrhea.     Mouth/Throat:     Mouth: Mucous membranes are moist.  Eyes:     General:        Right eye: No discharge.        Left eye: No discharge.     Extraocular Movements: Extraocular movements intact.     Conjunctiva/sclera: Conjunctivae normal.     Pupils: Pupils are equal, round, and reactive to light.  Cardiovascular:     Rate and Rhythm: Normal rate and regular rhythm.     Heart sounds: No murmur heard. Pulmonary:     Effort: Pulmonary effort is normal. No respiratory distress.     Breath sounds: Normal breath sounds. No wheezing, rhonchi or rales.  Abdominal:     General: Abdomen is flat. Bowel sounds are normal.  Musculoskeletal:     Cervical back: Normal range of motion and neck supple.  Skin:    General: Skin is warm and dry.     Capillary Refill: Capillary refill takes less than 2 seconds.  Neurological:     General: No focal deficit present.     Mental Status: He  is alert and oriented to person, place, and time.  Psychiatric:        Mood and Affect: Mood normal.        Behavior: Behavior normal.        Thought Content: Thought content normal.        Judgment: Judgment normal.    Results for orders placed or performed in visit on 07/08/20  Microscopic Examination   Urine  Result Value Ref Range   WBC, UA None seen 0 - 5 /hpf   RBC 3-10 (A) 0 - 2 /hpf   Epithelial Cells (non renal) 0-10 0 - 10 /hpf   Bacteria, UA None seen None seen/Few  TSH  Result Value Ref Range   TSH 1.810 0.450 - 4.500 uIU/mL  PSA  Result Value Ref Range   Prostate Specific Ag, Serum 1.4 0.0 - 4.0 ng/mL  Lipid panel  Result Value Ref Range   Cholesterol, Total 242 (H) 100 - 199 mg/dL   Triglycerides 106 0 - 149 mg/dL   HDL 64 >39 mg/dL   VLDL Cholesterol Cal 19 5 - 40 mg/dL   LDL Chol Calc (NIH) 159 (H) 0 - 99 mg/dL   Chol/HDL Ratio 3.8 0.0 - 5.0 ratio  CBC with Differential/Platelet  Result Value Ref Range   WBC 8.9 3.4 - 10.8 x10E3/uL   RBC 5.18 4.14 -  5.80 x10E6/uL   Hemoglobin 16.1 13.0 - 17.7 g/dL   Hematocrit 48.0 37.5 - 51.0 %   MCV 93 79 - 97 fL   MCH 31.1 26.6 - 33.0 pg   MCHC 33.5 31.5 - 35.7 g/dL   RDW 12.5 11.6 - 15.4 %   Platelets 416 150 - 450 x10E3/uL   Neutrophils 66 Not Estab. %   Lymphs 24 Not Estab. %   Monocytes 8 Not Estab. %   Eos 1 Not Estab. %   Basos 1 Not Estab. %   Neutrophils Absolute 6.0 1.4 - 7.0 x10E3/uL   Lymphocytes Absolute 2.2 0.7 - 3.1 x10E3/uL   Monocytes Absolute 0.7 0.1 - 0.9 x10E3/uL   EOS (ABSOLUTE) 0.1 0.0 - 0.4 x10E3/uL   Basophils Absolute 0.1 0.0 - 0.2 x10E3/uL   Immature Granulocytes 0 Not Estab. %   Immature Grans (Abs) 0.0 0.0 - 0.1 x10E3/uL  Comprehensive metabolic panel  Result Value Ref Range   Glucose 99 65 - 99 mg/dL   BUN 9 6 - 24 mg/dL   Creatinine, Ser 0.64 (L) 0.76 - 1.27 mg/dL   eGFR 120 >59 mL/min/1.73   BUN/Creatinine Ratio 14 9 - 20   Sodium 140 134 - 144 mmol/L   Potassium 4.5 3.5 - 5.2 mmol/L   Chloride 98 96 - 106 mmol/L   CO2 25 20 - 29 mmol/L   Calcium 9.6 8.7 - 10.2 mg/dL   Total Protein 7.7 6.0 - 8.5 g/dL   Albumin 5.1 (H) 4.0 - 5.0 g/dL   Globulin, Total 2.6 1.5 - 4.5 g/dL   Albumin/Globulin Ratio 2.0 1.2 - 2.2   Bilirubin Total 0.5 0.0 - 1.2 mg/dL   Alkaline Phosphatase 76 44 - 121 IU/L   AST 21 0 - 40 IU/L   ALT 22 0 - 44 IU/L  Urinalysis, Routine w reflex microscopic  Result Value Ref Range   Specific Gravity, UA 1.015 1.005 - 1.030   pH, UA 7.0 5.0 - 7.5   Color, UA Yellow Yellow   Appearance Ur Clear Clear   Leukocytes,UA Negative Negative   Protein,UA Negative  Negative/Trace   Glucose, UA Negative Negative   Ketones, UA Negative Negative   RBC, UA Trace (A) Negative   Bilirubin, UA Negative Negative   Urobilinogen, Ur 0.2 0.2 - 1.0 mg/dL   Nitrite, UA Negative Negative   Microscopic Examination See below:   Hepatitis C Antibody  Result Value Ref Range   Hep C Virus Ab <0.1 0.0 - 0.9 s/co ratio  HIV Antibody (routine testing w rflx)   Result Value Ref Range   HIV Screen 4th Generation wRfx Non Reactive Non Reactive      Assessment & Plan:   Problem List Items Addressed This Visit       Other   OCD (obsessive compulsive disorder) - Primary    Chronic.  Controlled.  Continue with current medication regimen of Celexa 36m daily.  Labs ordered today.  Return to clinic in 3 months for reevaluation.  Call sooner if concerns arise.        Tobacco abuse    Recommend stopping drinking for 1 month then picking a day to stop smoking.  Patient agrees with the plan of care.  Will follow up in 3 months for reevaluation.       Other Visit Diagnoses     Other fatigue       Labs ordered today to evaluate causes of fatigue/low energy. Will make recommendations based on lab results.   Relevant Orders   Testosterone, free, total([facility])   TSH   T4, free   CBC w/Diff        Follow up plan: Return in about 3 months (around 01/07/2021) for OCD and cholesterol check .

## 2020-10-08 ENCOUNTER — Ambulatory Visit: Payer: BC Managed Care – PPO | Admitting: Nurse Practitioner

## 2020-10-08 ENCOUNTER — Encounter: Payer: Self-pay | Admitting: Nurse Practitioner

## 2020-10-08 ENCOUNTER — Other Ambulatory Visit: Payer: Self-pay

## 2020-10-08 VITALS — BP 100/58 | HR 80 | Temp 99.0°F | Ht 73.0 in | Wt 183.6 lb

## 2020-10-08 DIAGNOSIS — F429 Obsessive-compulsive disorder, unspecified: Secondary | ICD-10-CM

## 2020-10-08 DIAGNOSIS — R5383 Other fatigue: Secondary | ICD-10-CM | POA: Diagnosis not present

## 2020-10-08 DIAGNOSIS — Z720 Tobacco use: Secondary | ICD-10-CM

## 2020-10-08 NOTE — Assessment & Plan Note (Signed)
Recommend stopping drinking for 1 month then picking a day to stop smoking.  Patient agrees with the plan of care.  Will follow up in 3 months for reevaluation.

## 2020-10-08 NOTE — Assessment & Plan Note (Addendum)
Chronic.  Controlled.  Continue with current medication regimen of Celexa 20mg  daily.  Labs ordered today.  Return to clinic in 3 months for reevaluation.  Call sooner if concerns arise.

## 2020-10-12 NOTE — Progress Notes (Signed)
Hi Ralph Lawson.  Your blood work is normal including the testosterone level.  Please let me know if you have any questions.  See you at our next visit.

## 2020-10-13 LAB — TESTOSTERONE, FREE, TOTAL, SHBG
Sex Hormone Binding: 32.3 nmol/L (ref 16.5–55.9)
Testosterone, Free: 7.2 pg/mL (ref 6.8–21.5)
Testosterone: 395 ng/dL (ref 264–916)

## 2020-10-13 LAB — CBC WITH DIFFERENTIAL/PLATELET
Basophils Absolute: 0 10*3/uL (ref 0.0–0.2)
Basos: 0 %
EOS (ABSOLUTE): 0.1 10*3/uL (ref 0.0–0.4)
Eos: 1 %
Hematocrit: 44 % (ref 37.5–51.0)
Hemoglobin: 14.9 g/dL (ref 13.0–17.7)
Immature Grans (Abs): 0 10*3/uL (ref 0.0–0.1)
Immature Granulocytes: 0 %
Lymphocytes Absolute: 2.1 10*3/uL (ref 0.7–3.1)
Lymphs: 29 %
MCH: 31.7 pg (ref 26.6–33.0)
MCHC: 33.9 g/dL (ref 31.5–35.7)
MCV: 94 fL (ref 79–97)
Monocytes Absolute: 0.6 10*3/uL (ref 0.1–0.9)
Monocytes: 9 %
Neutrophils Absolute: 4.3 10*3/uL (ref 1.4–7.0)
Neutrophils: 61 %
Platelets: 357 10*3/uL (ref 150–450)
RBC: 4.7 x10E6/uL (ref 4.14–5.80)
RDW: 12.1 % (ref 11.6–15.4)
WBC: 7.1 10*3/uL (ref 3.4–10.8)

## 2020-10-13 LAB — T4, FREE: Free T4: 1.21 ng/dL (ref 0.82–1.77)

## 2020-10-13 LAB — TSH: TSH: 1.69 u[IU]/mL (ref 0.450–4.500)

## 2020-10-14 NOTE — Progress Notes (Signed)
Hi Ralph Lawson.  Your testosterone was normal.  Let me know if you have any questions.

## 2020-12-17 NOTE — Progress Notes (Signed)
Acute Office Visit  Subjective:    Patient ID: Solomon Skowronek, male    DOB: Feb 03, 1975, 45 y.o.   MRN: [id number]  Chief Complaint  Patient presents with   Generalized Body Aches    Pt states he has been having body aches for the last 2 days. States that yesterday he also had some chills. Started coughing, having some congestion, and sneezing today.      HPI Patient is in today for body aches and sneezing for 2 days.  UPPER RESPIRATORY TRACT INFECTION  Worst symptom: body aches, fatigue Fever: yes Cough: yes - a little Shortness of breath: yes- intermittent  Wheezing: no Chest pain: yes Chest tightness: no Chest congestion: no Nasal congestion: yes Runny nose: yes Post nasal drip: no Sneezing: yes Sore throat: no Swollen glands: no Sinus pressure: yes Headache: yes Face pain: no Toothache: no Ear pain: no  Ear pressure: yes - bilateral  Eyes red/itching:yes Eye drainage/crusting: no  Vomiting: no Rash: no Fatigue: yes Sick contacts: yes - colleagues at work - unsure reason Strep contacts: no  Context: fluctuating Recurrent sinusitis: no Relief with OTC cold/cough medications: yes  Treatments attempted: tylenol, Tori Milks    Past Medical History:  Diagnosis Date   OCD (obsessive compulsive disorder)     Past Surgical History:  Procedure Laterality Date   collapsed lung     EYE SURGERY     R K Surgery    Family History  Problem Relation Age of Onset   Cancer Mother        Breast   Cancer Father        Skin   Autism Son    Dementia Maternal Grandmother    Cancer Maternal Grandfather        Colon   Alzheimer's disease Maternal Grandfather    Alcohol abuse Maternal Grandfather    Hypertension Paternal Grandmother    Hypertension Paternal Grandfather    Diabetes Paternal Grandfather     Social History   Socioeconomic History   Marital status: Married    Spouse name: Not on file   Number of children: Not on file   Years of education: Not on  file   Highest education level: Not on file  Occupational History   Not on file  Tobacco Use   Smoking status: Some Days    Packs/day: 1.00    Years: 30.00    Pack years: 30.00    Types: Cigarettes    Start date: 03/30/1990   Smokeless tobacco: Never  Vaping Use   Vaping Use: Some days  Substance and Sexual Activity   Alcohol use: Not Currently   Drug use: Yes    Types: Marijuana    Comment: on occasion   Sexual activity: Yes    Birth control/protection: None  Other Topics Concern   Not on file  Social History Narrative   Not on file   Social Determinants of Health   Financial Resource Strain: Not on file  Food Insecurity: Not on file  Transportation Needs: Not on file  Physical Activity: Not on file  Stress: Not on file  Social Connections: Not on file  Intimate Partner Violence: Not on file    Outpatient Medications Prior to Visit  Medication Sig Dispense Refill   citalopram (CELEXA) 20 MG tablet Take 1 tablet (20 mg total) by mouth daily. 90 tablet 1   rosuvastatin (CRESTOR) 5 MG tablet Take 1 tablet (5 mg total) by mouth daily. 90 tablet 1  No facility-administered medications prior to visit.    No Known Allergies  Review of Systems  Constitutional:  Positive for fatigue and fever.  HENT:  Positive for congestion, postnasal drip, rhinorrhea, sinus pressure and sneezing. Negative for sore throat.   Eyes: Negative.   Respiratory:  Positive for cough and shortness of breath (intermittent).   Cardiovascular: Negative.   Gastrointestinal: Negative.   Endocrine: Negative.   Genitourinary: Negative.   Musculoskeletal:  Positive for myalgias.  Skin: Negative.   Neurological:  Positive for headaches.       Brain fog  Psychiatric/Behavioral: Negative.        Objective:    Physical Exam Vitals and nursing note reviewed.  Constitutional:      Appearance: Normal appearance.  HENT:     Head: Normocephalic.     Right Ear: Tympanic membrane, ear canal and  external ear normal.     Left Ear: Tympanic membrane, ear canal and external ear normal.  Eyes:     Conjunctiva/sclera: Conjunctivae normal.  Cardiovascular:     Rate and Rhythm: Normal rate and regular rhythm.     Pulses: Normal pulses.     Heart sounds: Normal heart sounds.  Pulmonary:     Effort: Pulmonary effort is normal.     Breath sounds: Normal breath sounds.  Musculoskeletal:     Cervical back: Normal range of motion and neck supple. No tenderness.  Lymphadenopathy:     Cervical: No cervical adenopathy.  Skin:    General: Skin is warm.  Neurological:     General: No focal deficit present.     Mental Status: He is alert and oriented to person, place, and time.  Psychiatric:        Mood and Affect: Mood normal.        Behavior: Behavior normal.        Thought Content: Thought content normal.        Judgment: Judgment normal.    BP 100/64   Pulse 91   Temp 98.7 F (37.1 C) (Oral)   Wt 185 lb 6.4 oz (84.1 kg)   SpO2 98%   BMI 24.46 kg/m  Wt Readings from Last 3 Encounters:  12/18/20 185 lb 6.4 oz (84.1 kg)  10/08/20 183 lb 9.6 oz (83.3 kg)  07/08/20 186 lb 9.6 oz (84.6 kg)    Health Maintenance Due  Topic Date Due   COVID-19 Vaccine (3 - Booster for Moderna series) 08/17/2019   COLONOSCOPY (Pts 45-48yr Insurance coverage will need to be confirmed)  Never done   INFLUENZA VACCINE  Never done    There are no preventive care reminders to display for this patient.   Lab Results  Component Value Date   TSH 1.690 10/08/2020   Lab Results  Component Value Date   WBC 7.1 10/08/2020   HGB 14.9 10/08/2020   HCT 44.0 10/08/2020   MCV 94 10/08/2020   PLT 357 10/08/2020   Lab Results  Component Value Date   NA 140 07/08/2020   K 4.5 07/08/2020   CO2 25 07/08/2020   GLUCOSE 99 07/08/2020   BUN 9 07/08/2020   CREATININE 0.64 (L) 07/08/2020   BILITOT 0.5 07/08/2020   ALKPHOS 76 07/08/2020   AST 21 07/08/2020   ALT 22 07/08/2020   PROT 7.7 07/08/2020    ALBUMIN 5.1 (H) 07/08/2020   CALCIUM 9.6 07/08/2020   EGFR 120 07/08/2020   Lab Results  Component Value Date   CHOL 242 (H) 07/08/2020  Lab Results  Component Value Date   HDL 64 07/08/2020   Lab Results  Component Value Date   LDLCALC 159 (H) 07/08/2020   Lab Results  Component Value Date   TRIG 106 07/08/2020   Lab Results  Component Value Date   CHOLHDL 3.8 07/08/2020   No results found for: HGBA1C     Assessment & Plan:   Problem List Items Addressed This Visit   None Visit Diagnoses     Body aches    -  Primary   Flu negative, covid-19 pending. Will treat with prednsione taper. Encourage rest, fluids. Work note given. F/U if not improving.    Relevant Orders   Veritor Flu A/B Waived   Novel Coronavirus, NAA ([facility])   Chest pain, unspecified type       EKG showed normal sinus rhythm, no ST or T wave changes. Most likely due to viral infection. Can take tylenol/ibuprofen. F/U if not improving.    Relevant Orders   EKG 12-Lead (Completed)        Meds ordered this encounter  Medications   predniSONE (DELTASONE) 10 MG tablet    Sig: Take 6 tablets today, then 5 tablets tomorrow, then decrease by 1 tablet every day until gone    Dispense:  21 tablet    Refill:  0      Charyl Dancer, NP

## 2020-12-18 ENCOUNTER — Ambulatory Visit: Payer: BC Managed Care – PPO | Admitting: Nurse Practitioner

## 2020-12-18 ENCOUNTER — Encounter: Payer: Self-pay | Admitting: Nurse Practitioner

## 2020-12-18 ENCOUNTER — Other Ambulatory Visit: Payer: Self-pay

## 2020-12-18 VITALS — BP 100/64 | HR 91 | Temp 98.7°F | Wt 185.4 lb

## 2020-12-18 DIAGNOSIS — R52 Pain, unspecified: Secondary | ICD-10-CM

## 2020-12-18 DIAGNOSIS — R079 Chest pain, unspecified: Secondary | ICD-10-CM | POA: Diagnosis not present

## 2020-12-18 LAB — VERITOR FLU A/B WAIVED
Influenza A: NEGATIVE
Influenza B: NEGATIVE

## 2020-12-18 MED ORDER — PREDNISONE 10 MG PO TABS: ORAL_TABLET | ORAL | 0 refills | Status: DC

## 2020-12-18 NOTE — Progress Notes (Signed)
EKG interpreted by me on 12/18/20 showed normal sinus rhythm with heart rate 78. No ST or T wave changes.

## 2020-12-19 LAB — NOVEL CORONAVIRUS, NAA: SARS-CoV-2, NAA: DETECTED — AB

## 2021-01-07 ENCOUNTER — Ambulatory Visit: Payer: BC Managed Care – PPO | Admitting: Nurse Practitioner

## 2021-01-18 ENCOUNTER — Encounter: Payer: Self-pay | Admitting: Nurse Practitioner

## 2021-01-18 ENCOUNTER — Ambulatory Visit: Payer: BC Managed Care – PPO | Admitting: Nurse Practitioner

## 2021-01-18 ENCOUNTER — Other Ambulatory Visit: Payer: Self-pay

## 2021-01-18 VITALS — BP 108/58 | HR 89 | Temp 98.6°F | Wt 186.6 lb

## 2021-01-18 DIAGNOSIS — Z23 Encounter for immunization: Secondary | ICD-10-CM

## 2021-01-18 DIAGNOSIS — F429 Obsessive-compulsive disorder, unspecified: Secondary | ICD-10-CM | POA: Diagnosis not present

## 2021-01-18 DIAGNOSIS — R21 Rash and other nonspecific skin eruption: Secondary | ICD-10-CM | POA: Diagnosis not present

## 2021-01-18 DIAGNOSIS — Z720 Tobacco use: Secondary | ICD-10-CM | POA: Diagnosis not present

## 2021-01-18 MED ORDER — CITALOPRAM HYDROBROMIDE 20 MG PO TABS: 20.0000 mg | ORAL_TABLET | Freq: Every day | ORAL | 1 refills | Status: DC

## 2021-01-18 MED ORDER — CLOBETASOL PROPIONATE 0.05 % EX CREA: 1.0000 "application " | TOPICAL_CREAM | Freq: Two times a day (BID) | CUTANEOUS | 0 refills | Status: DC

## 2021-01-18 MED ORDER — ROSUVASTATIN CALCIUM 10 MG PO TABS: 5.0000 mg | ORAL_TABLET | Freq: Every day | ORAL | 1 refills | Status: DC

## 2021-01-18 NOTE — Progress Notes (Signed)
BP (!) 108/58    Pulse 89    Temp 98.6 F (37 C) (Oral)    Wt 186 lb 9.6 oz (84.6 kg)    SpO2 98%    BMI 24.62 kg/m    Subjective:    Patient ID: Ralph Lawson, male    DOB: [birth year], 46 y.o.   MRN: [id number]  HPI: Ralph Lawson is a 46 y.o. male  Chief Complaint  Patient presents with   OCD   Hyperlipidemia   OCD Patient states his OCD is doing well.  Denies concerns at visit today.  Feels like the Celexa is working well for him.  Denies anxiety/Depression.  SMOKING CESSATION Smoking Status: still smoking Smoking Amount: just under 1 ppd Smoking Onset:  Smoking Quit Date: wants to quit smoking but doesn't have a date yet. Smoking triggers:   RASH Duration:  weeks  Location: legs  Itching: no Burning: no Redness: yes Oozing: no Scaling: no Blisters: no Painful: no Fevers: no Change in detergents/soaps/personal care products: no Recent illness: yes Recent travel:no History of same: yes Context: stable Alleviating factors: nothing Treatments attempted: coconut oil Shortness of breath: no  Throat/tongue swelling: no Myalgias/arthralgias: no    Relevant past medical, surgical, family and social history reviewed and updated as indicated. Interim medical history since our last visit reviewed. Allergies and medications reviewed and updated.  Review of Systems  Psychiatric/Behavioral:  Negative for dysphoric mood. The patient is not nervous/anxious.        OCD is well controlled.    Per HPI unless specifically indicated above     Objective:    BP (!) 108/58    Pulse 89    Temp 98.6 F (37 C) (Oral)    Wt 186 lb 9.6 oz (84.6 kg)    SpO2 98%    BMI 24.62 kg/m   Wt Readings from Last 3 Encounters:  01/18/21 186 lb 9.6 oz (84.6 kg)  12/18/20 185 lb 6.4 oz (84.1 kg)  10/08/20 183 lb 9.6 oz (83.3 kg)    Physical Exam Vitals and nursing note reviewed.  Constitutional:      General: He is not in acute distress.    Appearance: Normal appearance. He is not  ill-appearing, toxic-appearing or diaphoretic.  HENT:     Head: Normocephalic.     Right Ear: External ear normal.     Left Ear: External ear normal.     Nose: Nose normal. No congestion or rhinorrhea.     Mouth/Throat:     Mouth: Mucous membranes are moist.  Eyes:     General:        Right eye: No discharge.        Left eye: No discharge.     Extraocular Movements: Extraocular movements intact.     Conjunctiva/sclera: Conjunctivae normal.     Pupils: Pupils are equal, round, and reactive to light.  Cardiovascular:     Rate and Rhythm: Normal rate and regular rhythm.     Heart sounds: No murmur heard. Pulmonary:     Effort: Pulmonary effort is normal. No respiratory distress.     Breath sounds: Normal breath sounds. No wheezing, rhonchi or rales.  Abdominal:     General: Abdomen is flat. Bowel sounds are normal.  Musculoskeletal:     Cervical back: Normal range of motion and neck supple.  Skin:    General: Skin is warm and dry.     Capillary Refill: Capillary refill takes less than 2 seconds.  Neurological:     General: No focal deficit present.     Mental Status: He is alert and oriented to person, place, and time.  Psychiatric:        Mood and Affect: Mood normal.        Behavior: Behavior normal.        Thought Content: Thought content normal.        Judgment: Judgment normal.    Results for orders placed or performed in visit on 12/18/20  Novel Coronavirus, NAA ([facility])   Specimen: Nasopharyngeal(NP) swabs in vial transport medium  Result Value Ref Range   SARS-CoV-2, NAA Detected (A) Not Detected  Veritor Flu A/B Waived  Result Value Ref Range   Influenza A Negative Negative   Influenza B Negative Negative      Assessment & Plan:   Problem List Items Addressed This Visit       Other   OCD (obsessive compulsive disorder) - Primary    Chronic.  Controlled.  Continue with current medication regimen on Celexa 53m.  Refill sent today.  Labs ordered  today.  Return to clinic in 6 months for reevaluation.  Call sooner if concerns arise.        Relevant Medications   citalopram (CELEXA) 20 MG tablet   Other Relevant Orders   Comp Met ([facility])   Lipid Profile   Tobacco abuse    Chronic. Ongoing.  Still has the desire to quit.  Plans to pick a day and quit [place]       Relevant Orders   Comp Met ([facility])   Lipid Profile   Other Visit Diagnoses     Rash       Will send clobetasol cream for discolored area on thigh. Follow up if not improved. Pt had tinea vesicolor in the past which resolved with oral antifungal.    Need for influenza vaccination       Relevant Orders   Flu Vaccine QUAD 638moM (Fluarix, Fluzone & Alfiuria Quad PF)        Follow up plan: Return in about 6 months (around 07/18/2021) for Physical and Fasting labs.

## 2021-01-18 NOTE — Assessment & Plan Note (Signed)
Chronic.  Controlled.  Continue with current medication regimen on Celexa 10mg .  Refill sent today.  Labs ordered today.  Return to clinic in 6 months for reevaluation.  Call sooner if concerns arise.

## 2021-01-18 NOTE — Assessment & Plan Note (Signed)
Chronic. Ongoing.  Still has the desire to quit.  Plans to pick a day and quit cold [place].

## 2021-01-19 LAB — LIPID PANEL
Chol/HDL Ratio: 3.4 ratio (ref 0.0–5.0)
Cholesterol, Total: 141 mg/dL (ref 100–199)
HDL: 41 mg/dL (ref >39)
LDL Chol Calc (NIH): 72 mg/dL (ref 0–99)
Triglycerides: 163 mg/dL — ABNORMAL HIGH (ref 0–149)
VLDL Cholesterol Cal: 28 mg/dL (ref 5–40)

## 2021-01-19 LAB — COMPREHENSIVE METABOLIC PANEL
ALT: 13 IU/L (ref 0–44)
AST: 13 IU/L (ref 0–40)
Albumin/Globulin Ratio: 2.1 (ref 1.2–2.2)
Albumin: 4.6 g/dL (ref 4.0–5.0)
Alkaline Phosphatase: 72 IU/L (ref 44–121)
BUN/Creatinine Ratio: 14 (ref 9–20)
BUN: 14 mg/dL (ref 6–24)
Bilirubin Total: 0.2 mg/dL (ref 0.0–1.2)
CO2: 29 mmol/L (ref 20–29)
Calcium: 9.9 mg/dL (ref 8.7–10.2)
Chloride: 103 mmol/L (ref 96–106)
Creatinine, Ser: 0.99 mg/dL (ref 0.76–1.27)
Globulin, Total: 2.2 g/dL (ref 1.5–4.5)
Glucose: 105 mg/dL — ABNORMAL HIGH (ref 70–99)
Potassium: 4.2 mmol/L (ref 3.5–5.2)
Sodium: 141 mmol/L (ref 134–144)
Total Protein: 6.8 g/dL (ref 6.0–8.5)
eGFR: 96 mL/min/{1.73_m2} (ref >59)

## 2021-01-19 NOTE — Progress Notes (Signed)
Hi Uday.  Overall your lab work looks good.  Your glucose was slightly elevated but that is likely due to not fasting.  We will recheck this at your next visit.  Follow up as discussed.

## 2021-02-04 ENCOUNTER — Ambulatory Visit: Payer: Self-pay

## 2021-02-04 NOTE — Telephone Encounter (Signed)
°  Chief Complaint: rectal bleeding Symptoms: rectal bleeding, diarrhea Frequency: 5 days Pertinent Negatives: Patient denies pain Disposition: [] ED /[] Urgent Care (no appt availability in office) / [x] Appointment(In office/virtual)/ []  [place] [facility]/ [] Home Care/ [] Refused Recommended Disposition /[] [place] [facility]/ []  Follow-up with PCP Additional Notes:    Reason for Disposition  MILD rectal bleeding (more than just a few drops or streaks)  Answer Assessment - Initial Assessment Questions 1. APPEARANCE of BLOOD: "What color is it?" "Is it passed separately, on the surface of the stool, or mixed in with the stool?"      When wiping and passing stools 2. AMOUNT: "How much blood was passed?"      Small amount 3. FREQUENCY: "How many times has blood been passed with the stools?"      Each time 4. ONSET: "When was the blood first seen in the stools?" (Days or weeks)      Monday 5. DIARRHEA: "Is there also some diarrhea?" If Yes, ask: "How many diarrhea stools in the past 24 hours?"      4-5 6. CONSTIPATION: "Do you have constipation?" If Yes, ask: "How bad is it?"     NO 8. BLOOD THINNERS: "Do you take any blood thinners?" (e.g., Coumadin/warfarin, Pradaxa/dabigatran, aspirin)     No 9. OTHER SYMPTOMS: "Do you have any other symptoms?"  (e.g., abdomen pain, vomiting, dizziness, fever)     Nausea  Protocols used: Rectal Bleeding-A-AH

## 2021-02-05 ENCOUNTER — Ambulatory Visit (INDEPENDENT_AMBULATORY_CARE_PROVIDER_SITE_OTHER): Payer: BC Managed Care – PPO | Admitting: Family Medicine

## 2021-02-05 ENCOUNTER — Other Ambulatory Visit: Payer: Self-pay

## 2021-02-05 ENCOUNTER — Encounter: Payer: Self-pay | Admitting: Family Medicine

## 2021-02-05 VITALS — BP 113/68 | HR 80 | Temp 98.3°F | Wt 183.6 lb

## 2021-02-05 DIAGNOSIS — Z1211 Encounter for screening for malignant neoplasm of colon: Secondary | ICD-10-CM | POA: Diagnosis not present

## 2021-02-05 DIAGNOSIS — K625 Hemorrhage of anus and rectum: Secondary | ICD-10-CM

## 2021-02-05 NOTE — Progress Notes (Signed)
BP 113/68    Pulse 80    Temp 98.3 F (36.8 C)    Wt 183 lb 9.6 oz (83.3 kg)    SpO2 97%    BMI 24.22 kg/m    Subjective:    Patient ID: Ralph Lawson, male    DOB: Jul 07, 1975, 46 y.o.   MRN: [id number]  HPI: [name] is a 46 y.o. male  Chief Complaint  Patient presents with   Rectal Bleeding    Patient states he noticed Monday there was blood in the toilet and was noticed when he cleaned himself after a  BM. Patient states the blood was initially dark red and is now bright red. There has been blood every day this week.    RECTAL BLEEDING Duration: about 4-5 days Bright red rectal bleeding: yes  Amount of blood:  initially heavy, now lighter   Frequency: with BMs Melena: no  Spotting on toilet tissue: yes  Anal fullness: yes  Perianal pain: no  Severity: moderate Perianal irritation/itching: no  Constipation: no  Chronic straining/valsava:  no  Anal trauma/intercourse: yes  Hemorrhoids: no  Previous colonoscopy: no    Relevant past medical, surgical, family and social history reviewed and updated as indicated. Interim medical history since our last visit reviewed. Allergies and medications reviewed and updated.  Review of Systems  Constitutional: Negative.   Respiratory: Negative.    Cardiovascular: Negative.   Gastrointestinal:  Positive for anal bleeding. Negative for abdominal distention, abdominal pain, blood in stool, constipation, diarrhea, nausea, rectal pain and vomiting.  Musculoskeletal: Negative.   Psychiatric/Behavioral: Negative.     Per HPI unless specifically indicated above     Objective:    BP 113/68    Pulse 80    Temp 98.3 F (36.8 C)    Wt 183 lb 9.6 oz (83.3 kg)    SpO2 97%    BMI 24.22 kg/m   Wt Readings from Last 3 Encounters:  02/05/21 183 lb 9.6 oz (83.3 kg)  01/18/21 186 lb 9.6 oz (84.6 kg)  12/18/20 185 lb 6.4 oz (84.1 kg)    Physical Exam Vitals and nursing note reviewed.  Constitutional:      General: He is not in acute  distress.    Appearance: Normal appearance. He is not ill-appearing, toxic-appearing or diaphoretic.  HENT:     Head: Normocephalic and atraumatic.     Right Ear: External ear normal.     Left Ear: External ear normal.     Nose: Nose normal.     Mouth/Throat:     Mouth: Mucous membranes are moist.     Pharynx: Oropharynx is clear.  Eyes:     General: No scleral icterus.       Right eye: No discharge.        Left eye: No discharge.     Extraocular Movements: Extraocular movements intact.     Conjunctiva/sclera: Conjunctivae normal.     Pupils: Pupils are equal, round, and reactive to light.  Cardiovascular:     Rate and Rhythm: Normal rate and regular rhythm.     Pulses: Normal pulses.     Heart sounds: Normal heart sounds. No murmur heard.   No friction rub. No gallop.  Pulmonary:     Effort: Pulmonary effort is normal. No respiratory distress.     Breath sounds: Normal breath sounds. No stridor. No wheezing, rhonchi or rales.  Chest:     Chest wall: No tenderness.  Genitourinary:   Musculoskeletal:  General: Normal range of motion.     Cervical back: Normal range of motion and neck supple.  Skin:    General: Skin is warm and dry.     Capillary Refill: Capillary refill takes less than 2 seconds.     Coloration: Skin is not jaundiced or pale.     Findings: No bruising, erythema, lesion or rash.  Neurological:     General: No focal deficit present.     Mental Status: He is alert and oriented to person, place, and time. Mental status is at baseline.  Psychiatric:        Mood and Affect: Mood normal.        Behavior: Behavior normal.        Thought Content: Thought content normal.        Judgment: Judgment normal.    Results for orders placed or performed in visit on 01/18/21  Comp Met (CMET)  Result Value Ref Range   Glucose 105 (H) 70 - 99 mg/dL   BUN 14 6 - 24 mg/dL   Creatinine, Ser 0.99 0.76 - 1.27 mg/dL   eGFR 96 >59 mL/min/1.73   BUN/Creatinine Ratio 14  9 - 20   Sodium 141 134 - 144 mmol/L   Potassium 4.2 3.5 - 5.2 mmol/L   Chloride 103 96 - 106 mmol/L   CO2 29 20 - 29 mmol/L   Calcium 9.9 8.7 - 10.2 mg/dL   Total Protein 6.8 6.0 - 8.5 g/dL   Albumin 4.6 4.0 - 5.0 g/dL   Globulin, Total 2.2 1.5 - 4.5 g/dL   Albumin/Globulin Ratio 2.1 1.2 - 2.2   Bilirubin Total 0.2 0.0 - 1.2 mg/dL   Alkaline Phosphatase 72 44 - 121 IU/L   AST 13 0 - 40 IU/L   ALT 13 0 - 44 IU/L  Lipid Profile  Result Value Ref Range   Cholesterol, Total 141 100 - 199 mg/dL   Triglycerides 163 (H) 0 - 149 mg/dL   HDL 41 >39 mg/dL   VLDL Cholesterol Cal 28 5 - 40 mg/dL   LDL Chol Calc (NIH) 72 0 - 99 mg/dL   Chol/HDL Ratio 3.4 0.0 - 5.0 ratio      Assessment & Plan:   Problem List Items Addressed This Visit   None Visit Diagnoses     Rectal bleeding    -  Primary   Small fissure. Will treat with sitz bath and vasaline. Call if not getting better or getting worse.    Screening for colon cancer       Referral to GI placed today.   Relevant Orders   Ambulatory referral to Gastroenterology        Follow up plan: Return if symptoms worsen or fail to improve.

## 2021-02-09 ENCOUNTER — Telehealth: Payer: Self-pay

## 2021-02-09 NOTE — Telephone Encounter (Signed)
CALLED PATIENT NO ANSWER LEFT VOICEMAIL FOR A CALL BACK °Letter sent °

## 2021-02-23 ENCOUNTER — Other Ambulatory Visit: Payer: Self-pay

## 2021-02-23 ENCOUNTER — Telehealth: Payer: Self-pay

## 2021-02-23 NOTE — Telephone Encounter (Signed)
Patient is ready to schedule colonoscopy

## 2021-02-24 ENCOUNTER — Other Ambulatory Visit: Payer: Self-pay

## 2021-02-24 DIAGNOSIS — Z1211 Encounter for screening for malignant neoplasm of colon: Secondary | ICD-10-CM

## 2021-02-24 MED ORDER — PEG 3350-KCL-NA BICARB-NACL 420 G PO SOLR: 4000.0000 mL | Freq: Once | ORAL | 0 refills | Status: AC

## 2021-02-24 NOTE — Progress Notes (Signed)
Gastroenterology Pre-Procedure Review  Request Date: 03/15/2021 Requesting Physician: Dr. Vicente Males  PATIENT REVIEW QUESTIONS: The patient responded to the following health history questions as indicated:    1. Are you having any GI issues? no 2. Do you have a personal history of Polyps? no 3. Do you have a family history of Colon Cancer or Polyps? yes (MATERNAL GRANDFATHER COLON CANCER) 4. Diabetes Mellitus? no 5. Joint replacements in the past 12 months?no 6. Major health problems in the past 3 months?no 7. Any artificial heart valves, MVP, or defibrillator?no    MEDICATIONS & ALLERGIES:    Patient reports the following regarding taking any anticoagulation/antiplatelet therapy:   Plavix, Coumadin, Eliquis, Xarelto, Lovenox, Pradaxa, Brilinta, or Effient? no Aspirin? no  Patient confirms/reports the following medications:  Current Outpatient Medications  Medication Sig Dispense Refill   citalopram (CELEXA) 20 MG tablet Take 1 tablet (20 mg total) by mouth daily. 90 tablet 1   clobetasol cream (TEMOVATE) 8.18 % Apply 1 application topically 2 (two) times daily. 30 g 0   rosuvastatin (CRESTOR) 10 MG tablet Take 0.5 tablets (5 mg total) by mouth daily. 90 tablet 1   No current facility-administered medications for this visit.    Patient confirms/reports the following allergies:  No Known Allergies  No orders of the defined types were placed in this encounter.   AUTHORIZATION INFORMATION Primary Insurance: 1D#: Group #:  Secondary Insurance: 1D#: Group #:  SCHEDULE INFORMATION: Date: 03/15/2021 Time: Location:[facility]

## 2021-03-12 ENCOUNTER — Encounter: Payer: Self-pay | Admitting: Gastroenterology

## 2021-03-15 ENCOUNTER — Ambulatory Visit
Admission: RE | Admit: 2021-03-15 | Discharge: 2021-03-15 | Disposition: A | Discharge status: Home / Self Care | Payer: BC Managed Care – PPO | Attending: Gastroenterology | Admitting: Gastroenterology

## 2021-03-15 ENCOUNTER — Encounter: Payer: Self-pay | Admitting: Gastroenterology

## 2021-03-15 ENCOUNTER — Encounter
Admission: RE | Disposition: A | Discharge status: Home / Self Care | Payer: Self-pay | Source: Home / Self Care | Attending: Gastroenterology

## 2021-03-15 ENCOUNTER — Other Ambulatory Visit: Payer: Self-pay

## 2021-03-15 ENCOUNTER — Ambulatory Visit: Payer: BC Managed Care – PPO | Admitting: Certified Registered"

## 2021-03-15 DIAGNOSIS — F17210 Nicotine dependence, cigarettes, uncomplicated: Secondary | ICD-10-CM | POA: Insufficient documentation

## 2021-03-15 DIAGNOSIS — D125 Benign neoplasm of sigmoid colon: Secondary | ICD-10-CM | POA: Diagnosis not present

## 2021-03-15 DIAGNOSIS — Z1211 Encounter for screening for malignant neoplasm of colon: Secondary | ICD-10-CM | POA: Diagnosis not present

## 2021-03-15 DIAGNOSIS — K635 Polyp of colon: Secondary | ICD-10-CM

## 2021-03-15 DIAGNOSIS — F419 Anxiety disorder, unspecified: Secondary | ICD-10-CM | POA: Diagnosis not present

## 2021-03-15 HISTORY — PX: COLONOSCOPY WITH PROPOFOL: SHX5780

## 2021-03-15 SURGERY — COLONOSCOPY WITH PROPOFOL
Anesthesia: General

## 2021-03-15 MED ORDER — PROPOFOL 10 MG/ML IV BOLUS
INTRAVENOUS | Status: DC | PRN
Start: 2021-03-15 — End: 2021-03-15
  Administered 2021-03-15: 100 mg via INTRAVENOUS

## 2021-03-15 MED ORDER — GLYCOPYRROLATE 0.2 MG/ML IJ SOLN
INTRAMUSCULAR | Status: AC
  Filled 2021-03-15: qty 1

## 2021-03-15 MED ORDER — STERILE WATER FOR IRRIGATION IR SOLN
Status: DC | PRN
  Administered 2021-03-15: 60 mL

## 2021-03-15 MED ORDER — SODIUM CHLORIDE 0.9 % IV SOLN: INTRAVENOUS | Status: DC

## 2021-03-15 MED ORDER — LIDOCAINE 2% (20 MG/ML) 5 ML SYRINGE
INTRAMUSCULAR | Status: DC | PRN
  Administered 2021-03-15: 20 mg via INTRAVENOUS

## 2021-03-15 MED ORDER — GLYCOPYRROLATE 0.2 MG/ML IJ SOLN
INTRAMUSCULAR | Status: DC | PRN
  Administered 2021-03-15: .2 mg via INTRAVENOUS

## 2021-03-15 MED ORDER — MIDAZOLAM HCL 2 MG/2ML IJ SOLN
INTRAMUSCULAR | Status: AC
  Filled 2021-03-15: qty 2

## 2021-03-15 MED ORDER — FENTANYL CITRATE (PF) 100 MCG/2ML IJ SOLN
INTRAMUSCULAR | Status: DC | PRN
  Administered 2021-03-15 (×2): 50 ug via INTRAVENOUS

## 2021-03-15 MED ORDER — PROPOFOL 500 MG/50ML IV EMUL
INTRAVENOUS | Status: DC | PRN
  Administered 2021-03-15: 120 ug/kg/min via INTRAVENOUS

## 2021-03-15 MED ORDER — MIDAZOLAM HCL 5 MG/5ML IJ SOLN
INTRAMUSCULAR | Status: DC | PRN
  Administered 2021-03-15: 2 mg via INTRAVENOUS

## 2021-03-15 MED ORDER — FENTANYL CITRATE (PF) 100 MCG/2ML IJ SOLN
INTRAMUSCULAR | Status: AC
  Filled 2021-03-15: qty 2

## 2021-03-15 NOTE — Anesthesia Preprocedure Evaluation (Signed)
Anesthesia Evaluation  ?Patient identified by MRN, date of birth, ID band ?Patient awake ? ? ? ?Reviewed: ?Allergy & Precautions, H&P , NPO status , Patient's Chart, lab work & pertinent test results, reviewed documented beta blocker date and time  ? ?Airway ?Mallampati: II ? ? ?Neck ROM: full ? ? ? Dental ? ?(+) Poor Dentition ?  ?Pulmonary ?neg pulmonary ROS, Current Smoker,  ?  ?Pulmonary exam normal ? ? ? ? ? ? ? Cardiovascular ?Exercise Tolerance: Good ?negative cardio ROS ?Normal cardiovascular exam ?Rhythm:regular Rate:Normal ? ? ?  ?Neuro/Psych ?PSYCHIATRIC DISORDERS Anxiety negative neurological ROS ?   ? GI/Hepatic ?negative GI ROS, Neg liver ROS,   ?Endo/Other  ?negative endocrine ROS ? Renal/GU ?negative Renal ROS  ?negative genitourinary ?  ?Musculoskeletal ? ? Abdominal ?  ?Peds ? Hematology ?negative hematology ROS ?(+)   ?Anesthesia Other Findings ?Past Medical History: ?No date: OCD (obsessive compulsive disorder) ?Past Surgical History: ?No date: collapsed lung ?No date: EYE SURGERY ?    Comment:  [facility] Surgery ?BMI   ? Body Mass Index: 24.08 kg/m?  ?  ? Reproductive/Obstetrics ?negative OB ROS ? ?  ? ? ? ? ? ? ? ? ? ? ? ? ? ?  ?  ? ? ? ? ? ? ? ? ?Anesthesia Physical ?Anesthesia Plan ? ?ASA: 2 ? ?Anesthesia Plan: General  ? ?Post-op Pain Management:   ? ?Induction:  ? ?PONV Risk Score and Plan:  ? ?Airway Management Planned:  ? ?Additional Equipment:  ? ?Intra-op Plan:  ? ?Post-operative Plan:  ? ?Informed Consent: I have reviewed the patients History and Physical, chart, labs and discussed the procedure including the risks, benefits and alternatives for the proposed anesthesia with the patient or authorized representative who has indicated his/her understanding and acceptance.  ? ? ? ?Dental Advisory Given ? ?Plan Discussed with: CRNA ? ?Anesthesia Plan Comments:   ? ? ? ? ? ? ?Anesthesia Quick Evaluation ? ?

## 2021-03-15 NOTE — H&P (Signed)
? ? ? ?Jonathon Bellows, MD ?[address], [place], [place], [place], [address] ?[address], [place], [place], [place], [address] ?Phone: [phone]  ?Fax: [phone] ? ?Primary Care Physician:  Jon Billings, NP ? ? ?Pre-Procedure History & Physical: ?HPI:  Ralph Lawson is a 46 y.o. male is here for an colonoscopy. ?  ?Past Medical History:  ?Diagnosis Date  ? OCD (obsessive compulsive disorder)   ? ? ?Past Surgical History:  ?Procedure Laterality Date  ? collapsed lung    ? EYE SURGERY    ? [facility] Surgery  ? ? ?Prior to Admission medications   ?Medication Sig Start Date End Date Taking? Authorizing Provider  ?citalopram (CELEXA) 20 MG tablet Take 1 tablet (20 mg total) by mouth daily. 01/18/21  Yes Jon Billings, NP  ?clobetasol cream (TEMOVATE) 5.78 % Apply 1 application topically 2 (two) times daily. 01/18/21  Yes Jon Billings, NP  ?rosuvastatin (CRESTOR) 10 MG tablet Take 0.5 tablets (5 mg total) by mouth daily. 01/18/21  Yes Jon Billings, NP  ? ? ?Allergies as of 02/24/2021  ? (No Known Allergies)  ? ? ?Family History  ?Problem Relation Age of Onset  ? Cancer Mother   ?     Breast  ? Cancer Father   ?     Skin  ? Autism Son   ? Dementia Maternal Grandmother   ? Cancer Maternal Grandfather   ?     Colon  ? Alzheimer's disease Maternal Grandfather   ? Alcohol abuse Maternal Grandfather   ? Hypertension Paternal Grandmother   ? Hypertension Paternal Grandfather   ? Diabetes Paternal Grandfather   ? ? ?Social History  ? ?Socioeconomic History  ? Marital status: Married  ?  Spouse name: Not on file  ? Number of children: Not on file  ? Years of education: Not on file  ? Highest education level: Not on file  ?Occupational History  ? Not on file  ?Tobacco Use  ? Smoking status: Some Days  ?  Packs/day: 1.00  ?  Years: 30.00  ?  Pack years: 30.00  ?  Types: Cigarettes  ?  Start date: 03/30/1990  ? Smokeless tobacco: Never  ?Vaping Use  ? Vaping Use: Some days  ?Substance and Sexual Activity  ? Alcohol use:  Yes  ?  Alcohol/week: 3.0 standard drinks  ?  Types: 3 Cans of beer per week  ? Drug use: Yes  ?  Types: Marijuana  ?  Comment: 03/13/21  ? Sexual activity: Yes  ?  Birth control/protection: None  ?Other Topics Concern  ? Not on file  ?Social History Narrative  ? Not on file  ? ?Social Determinants of Health  ? ?Financial Resource Strain: Not on file  ?Food Insecurity: Not on file  ?Transportation Needs: Not on file  ?Physical Activity: Not on file  ?Stress: Not on file  ?Social Connections: Not on file  ?Intimate Partner Violence: Not on file  ? ? ?Review of Systems: ?See HPI, otherwise negative ROS ? ?Physical Exam: ?BP 112/73   Pulse 87   Temp 97.7 ?F (36.5 ?C) (Temporal)   Ht 6' 1.5" (1.867 m)   Wt 83.9 kg   SpO2 100%   BMI 24.08 kg/m?  ?General:   Alert,  pleasant and cooperative in NAD ?Head:  Normocephalic and atraumatic. ?Neck:  Supple; no masses or thyromegaly. ?Lungs:  Clear throughout to auscultation, normal respiratory effort.    ?Heart:  +S1, +S2, Regular rate and rhythm, No  edema. ?Abdomen:  Soft, nontender and nondistended. Normal bowel sounds, without guarding, and without rebound.   ?Neurologic:  Alert and  oriented x4;  grossly normal neurologically. ? ?Impression/Plan: ?Ralph Lawson is here for an colonoscopy to be performed for Screening colonoscopy average risk   ?Risks, benefits, limitations, and alternatives regarding  colonoscopy have been reviewed with the patient.  Questions have been answered.  All parties agreeable. ? ? ?Jonathon Bellows, MD  03/15/2021, 11:09 AM ? ?

## 2021-03-15 NOTE — Op Note (Signed)
[facility] ?Gastroenterology ?Patient Name: Ralph Lawson ?Procedure Date: 03/15/2021 11:11 AM ?MRN: [id number] ?Account #: [id number] ?Date of Birth: 02-25-1975 ?Admit Type: Outpatient ?Age: 46 ?Room: [facility] ENDO ROOM 4 ?Gender: Male ?Note Status: Finalized ?Instrument Name: Colonoscope [id number] ?Procedure:             Colonoscopy ?Indications:           Screening for colorectal malignant neoplasm ?Providers:             Jonathon Bellows MD, MD ?Referring MD:          Jon Billings (Referring MD) ?Medicines:             Monitored Anesthesia Care ?Complications:         No immediate complications. ?Procedure:             Pre-Anesthesia Assessment: ?                       - Prior to the procedure, a History and Physical was  ?                       performed, and patient medications, allergies and  ?                       sensitivities were reviewed. The patient's tolerance  ?                       of previous anesthesia was reviewed. ?                       - The risks and benefits of the procedure and the  ?                       sedation options and risks were discussed with the  ?                       patient. All questions were answered and informed  ?                       consent was obtained. ?                       - ASA Grade Assessment: II - A patient with mild  ?                       systemic disease. ?                       After obtaining informed consent, the colonoscope was  ?                       passed under direct vision. Throughout the procedure,  ?                       the patient's blood pressure, pulse, and oxygen  ?                       saturations were monitored continuously. The  ?                       Colonoscope was introduced through  the anus and  ?                       advanced to the the cecum, identified by the  ?                       appendiceal orifice. The colonoscopy was performed  ?                       with ease. The patient tolerated the procedure well.  ?                        The quality of the bowel preparation was adequate. ?Findings: ?     The perianal and digital rectal examinations were normal. ?     A 15 mm polyp was found in the sigmoid colon. The polyp was  ?     pedunculated. The polyp was removed with a hot snare. Resection and  ?     retrieval were complete. To prevent bleeding after the polypectomy, one  ?     hemostatic clip was successfully placed. There was no bleeding at the  ?     end of the procedure. ?     The exam was otherwise without abnormality on direct and retroflexion  ?     views. ?Impression:            - One 15 mm polyp in the sigmoid colon, removed with a  ?                       hot snare. Resected and retrieved. Clip was placed. ?                       - The examination was otherwise normal on direct and  ?                       retroflexion views. ?Recommendation:        - Discharge patient to home (with escort). ?                       - Resume previous diet. ?                       - Continue present medications. ?                       - Await pathology results. ?                       - Repeat colonoscopy in 3 years for surveillance. ?Procedure Code(s):     --- Professional --- ?                       [id number], Colonoscopy, flexible; with removal of  ?                       tumor(s), polyp(s), or other lesion(s) by snare  ?                       technique ?Diagnosis Code(s):     --- Professional --- ?  Z12.11, Encounter for screening for malignant neoplasm  ?                       of colon ?                       K63.5, Polyp of colon ?CPT copyright 2019 American Medical Association. All rights reserved. ?The codes documented in this report are preliminary and upon coder review may  ?be revised to meet current compliance requirements. ?Jonathon Bellows, MD ?Jonathon Bellows MD, MD ?03/15/2021 11:33:36 AM ?This report has been signed electronically. ?Number of Addenda: 0 ?Note Initiated On: 03/15/2021 11:11 AM ?Scope Withdrawal Time:  0 hours 7 minutes 44 seconds  ?Total Procedure Duration: 0 hours 11 minutes 17 seconds  ?Estimated Blood Loss:  Estimated blood loss: none. ?     [facility] ?

## 2021-03-15 NOTE — Transfer of Care (Signed)
Immediate Anesthesia Transfer of Care Note ? ?Patient: Ralph Lawson ? ?Procedure(s) Performed: COLONOSCOPY WITH PROPOFOL ? ?Patient Location: Endoscopy Unit ? ?Anesthesia Type:General ? ?Level of Consciousness: drowsy ? ?Airway & Oxygen Therapy: Patient Spontanous Breathing ? ?Post-op Assessment: Report given to RN and Post -op Vital signs reviewed and stable ? ?Post vital signs: Reviewed ? ?Last Vitals:  ?Vitals Value Taken Time  ?BP 92/60 03/15/21 1134  ?Temp 36.1 ?C 03/15/21 1134  ?Pulse 77 03/15/21 1134  ?Resp 17 03/15/21 1134  ?SpO2 96 % 03/15/21 1134  ? ? ?Last Pain:  ?Vitals:  ? 03/15/21 1134  ?TempSrc: Temporal  ?PainSc: Asleep  ?   ? ?  ? ?Complications: No notable events documented. ?

## 2021-03-16 ENCOUNTER — Encounter: Payer: Self-pay | Admitting: Gastroenterology

## 2021-03-16 LAB — SURGICAL PATHOLOGY

## 2021-03-16 NOTE — Anesthesia Postprocedure Evaluation (Signed)
Anesthesia Post Note ? ?Patient: Kaedan Ivery ? ?Procedure(s) Performed: COLONOSCOPY WITH PROPOFOL ? ?Patient location during evaluation: PACU ?Anesthesia Type: General ?Level of consciousness: awake and alert ?Pain management: pain level controlled ?Vital Signs Assessment: post-procedure vital signs reviewed and stable ?Respiratory status: spontaneous breathing, nonlabored ventilation, respiratory function stable and patient connected to nasal cannula oxygen ?Cardiovascular status: blood pressure returned to baseline and stable ?Postop Assessment: no apparent nausea or vomiting ?Anesthetic complications: no ? ? ?No notable events documented. ? ? ?Last Vitals:  ?Vitals:  ? 03/15/21 1144 03/15/21 1154  ?BP: 105/75 115/85  ?Pulse: 95 89  ?Resp: 19 (!) 23  ?Temp:    ?SpO2: 98% 99%  ?  ?Last Pain:  ?Vitals:  ? 03/16/21 0732  ?TempSrc:   ?PainSc: 0-No pain  ? ? ?  ?  ?  ?  ?  ?  ? ?Molli Barrows ? ? ? ? ?

## 2021-04-22 ENCOUNTER — Ambulatory Visit: Payer: Self-pay

## 2021-04-22 ENCOUNTER — Emergency Department
Admission: EM | Admit: 2021-04-22 | Discharge: 2021-04-22 | Disposition: A | Discharge status: Home / Self Care | Payer: BC Managed Care – PPO | Attending: Emergency Medicine | Admitting: Emergency Medicine

## 2021-04-22 ENCOUNTER — Encounter: Payer: Self-pay | Admitting: Emergency Medicine

## 2021-04-22 ENCOUNTER — Emergency Department: Payer: BC Managed Care – PPO

## 2021-04-22 ENCOUNTER — Other Ambulatory Visit: Payer: Self-pay

## 2021-04-22 DIAGNOSIS — E860 Dehydration: Secondary | ICD-10-CM | POA: Insufficient documentation

## 2021-04-22 DIAGNOSIS — D72829 Elevated white blood cell count, unspecified: Secondary | ICD-10-CM | POA: Insufficient documentation

## 2021-04-22 DIAGNOSIS — R1031 Right lower quadrant pain: Secondary | ICD-10-CM | POA: Diagnosis not present

## 2021-04-22 DIAGNOSIS — R197 Diarrhea, unspecified: Secondary | ICD-10-CM | POA: Insufficient documentation

## 2021-04-22 DIAGNOSIS — R824 Acetonuria: Secondary | ICD-10-CM | POA: Insufficient documentation

## 2021-04-22 DIAGNOSIS — R1084 Generalized abdominal pain: Secondary | ICD-10-CM | POA: Diagnosis not present

## 2021-04-22 DIAGNOSIS — R112 Nausea with vomiting, unspecified: Secondary | ICD-10-CM | POA: Insufficient documentation

## 2021-04-22 DIAGNOSIS — I700 Atherosclerosis of aorta: Secondary | ICD-10-CM

## 2021-04-22 DIAGNOSIS — N281 Cyst of kidney, acquired: Secondary | ICD-10-CM | POA: Diagnosis not present

## 2021-04-22 HISTORY — DX: Atherosclerosis of aorta: I70.0

## 2021-04-22 LAB — URINALYSIS, ROUTINE W REFLEX MICROSCOPIC
Bilirubin Urine: NEGATIVE
Glucose, UA: NEGATIVE mg/dL
Ketones, ur: 20 mg/dL — AB
Leukocytes,Ua: NEGATIVE
Nitrite: NEGATIVE
Protein, ur: NEGATIVE mg/dL
Specific Gravity, Urine: 1.027 (ref 1.005–1.030)
Squamous Epithelial / HPF: NONE SEEN (ref 0–5)
pH: 5 (ref 5.0–8.0)

## 2021-04-22 LAB — CBC
HCT: 48.9 % (ref 39.0–52.0)
Hemoglobin: 16.5 g/dL (ref 13.0–17.0)
MCH: 30.6 pg (ref 26.0–34.0)
MCHC: 33.7 g/dL (ref 30.0–36.0)
MCV: 90.6 fL (ref 80.0–100.0)
Platelets: 363 10*3/uL (ref 150–400)
RBC: 5.4 MIL/uL (ref 4.22–5.81)
RDW: 13 % (ref 11.5–15.5)
WBC: 11.9 10*3/uL — ABNORMAL HIGH (ref 4.0–10.5)
nRBC: 0 % (ref 0.0–0.2)

## 2021-04-22 LAB — COMPREHENSIVE METABOLIC PANEL
ALT: 22 U/L (ref 0–44)
AST: 20 U/L (ref 15–41)
Albumin: 5.2 g/dL — ABNORMAL HIGH (ref 3.5–5.0)
Alkaline Phosphatase: 61 U/L (ref 38–126)
Anion gap: 8 (ref 5–15)
BUN: 15 mg/dL (ref 6–20)
CO2: 24 mmol/L (ref 22–32)
Calcium: 9.4 mg/dL (ref 8.9–10.3)
Chloride: 102 mmol/L (ref 98–111)
Creatinine, Ser: 0.78 mg/dL (ref 0.61–1.24)
GFR, Estimated: >60 mL/min (ref >60)
Glucose, Bld: 113 mg/dL — ABNORMAL HIGH (ref 70–99)
Potassium: 4 mmol/L (ref 3.5–5.1)
Sodium: 134 mmol/L — ABNORMAL LOW (ref 135–145)
Total Bilirubin: 1.1 mg/dL (ref 0.3–1.2)
Total Protein: 8.3 g/dL — ABNORMAL HIGH (ref 6.5–8.1)

## 2021-04-22 LAB — LIPASE, BLOOD: Lipase: 28 U/L (ref 11–51)

## 2021-04-22 MED ORDER — IOHEXOL 300 MG/ML  SOLN
100.0000 mL | Freq: Once | INTRAMUSCULAR | Status: AC | PRN
  Administered 2021-04-22: 100 mL via INTRAVENOUS

## 2021-04-22 MED ORDER — ONDANSETRON 4 MG PO TBDP: 4.0000 mg | ORAL_TABLET | Freq: Three times a day (TID) | ORAL | 0 refills | Status: DC | PRN

## 2021-04-22 MED ORDER — LACTATED RINGERS IV BOLUS
1000.0000 mL | Freq: Once | INTRAVENOUS | Status: AC
  Administered 2021-04-22: 1000 mL via INTRAVENOUS

## 2021-04-22 NOTE — ED Provider Notes (Signed)
? ?[facility] ?Provider Note ? ? ? Event Date/Time  ? First MD Initiated Contact with Patient 04/22/21 1019   ?  (approximate) ? ? ?History  ? ?Abdominal Pain, Emesis, and Diarrhea ? ? ?HPI ? ?Ralph Lawson is a 46 y.o. male who presents to the ED for evaluation of Abdominal Pain, Emesis, and Diarrhea ?  ?I review colonoscopy performed on 3/6.  Single 15 mm polyp to mid sigmoid colon, removed with hot snare.  Otherwise reassuring examination. ? ?Patient presents to the ED for evaluation of recurrent emesis and diarrhea that started earlier this morning.  He reports awaking from sleep with abdominal cramping and recurrent emesis.  Reports these episodes of nonbloody nonbilious emesis and 10-12 episodes of watery diarrhea.  Denies any blood.  Denies any syncope, fever or hematuria/dysuria.  Reports diffuse abdominal cramping.  Denies surgical history to his abdomen, beyond the colonoscopy. ? ?Physical Exam  ? ?Triage Vital Signs: ?ED Triage Vitals  ?Enc Vitals Group  ?   BP 04/22/21 1021 124/85  ?   Pulse Rate 04/22/21 1021 (!) 131  ?   Resp 04/22/21 1021 16  ?   Temp 04/22/21 1021 98.8 ?F (37.1 ?C)  ?   Temp Source 04/22/21 1021 Oral  ?   SpO2 04/22/21 1021 98 %  ?   Weight 04/22/21 1018 184 lb 15.5 oz (83.9 kg)  ?   Height 04/22/21 1018 6' 1.5" (1.867 m)  ?   Head Circumference --   ?   Peak Flow --   ?   Pain Score 04/22/21 1017 8  ?   Pain Loc --   ?   Pain Edu? --   ?   Excl. in [place]? --   ? ? ?Most recent vital signs: ?Vitals:  ? 04/22/21 1021  ?BP: 124/85  ?Pulse: (!) 131  ?Resp: 16  ?Temp: 98.8 ?F (37.1 ?C)  ?SpO2: 98%  ? ? ?General: Awake, no distress.  Pleasant and conversational, ambulates with normal gait back to his room.  Dry mucous membranes. ?CV:  Good peripheral perfusion.  Tachycardic and regular ?Resp:  Normal effort.  ?Abd:  No distention.  Diffuse and mild tenderness without peritoneal features. ?MSK:  No deformity noted.  ?Neuro:  No focal deficits appreciated. ?Other:   ? ? ?ED  Results / Procedures / Treatments  ? ?Labs ?(all labs ordered are listed, but only abnormal results are displayed) ?Labs Reviewed  ?COMPREHENSIVE METABOLIC PANEL - Abnormal; Notable for the following components:  ?    Result Value  ? Sodium 134 (*)   ? Glucose, Bld 113 (*)   ? Total Protein 8.3 (*)   ? Albumin 5.2 (*)   ? All other components within normal limits  ?CBC - Abnormal; Notable for the following components:  ? WBC 11.9 (*)   ? All other components within normal limits  ?URINALYSIS, ROUTINE W REFLEX MICROSCOPIC - Abnormal; Notable for the following components:  ? Color, Urine YELLOW (*)   ? APPearance CLEAR (*)   ? Hgb urine dipstick MODERATE (*)   ? Ketones, ur 20 (*)   ? Bacteria, UA FEW (*)   ? All other components within normal limits  ?LIPASE, BLOOD  ? ? ?EKG ? ? ?RADIOLOGY ?CT abdomen/pelvis reviewed by me without evidence of acute intra-abdominal pathology.  Normal appendix. ? ?Official radiology report(s): ?CT ABDOMEN PELVIS W CONTRAST ? ?Result Date: 04/22/2021 ?CLINICAL DATA:  Right lower quadrant abdominal pain with nausea, vomiting and  diarrhea EXAM: CT ABDOMEN AND PELVIS WITH CONTRAST TECHNIQUE: Multidetector CT imaging of the abdomen and pelvis was performed using the standard protocol following bolus administration of intravenous contrast. RADIATION DOSE REDUCTION: This exam was performed according to the departmental dose-optimization program which includes automated exposure control, adjustment of the mA and/or kV according to patient size and/or use of iterative reconstruction technique. CONTRAST:  167m OMNIPAQUE IOHEXOL 300 MG/ML  SOLN COMPARISON:  None. FINDINGS: Lower chest: No acute abnormality. Hepatobiliary: No focal liver abnormality is seen. No gallstones, gallbladder wall thickening, or biliary dilatation. Pancreas: Unremarkable. No pancreatic ductal dilatation or surrounding inflammatory changes. Spleen: Normal in size without focal abnormality. Adrenals/Urinary Tract: Normal  adrenal glands. Both kidneys demonstrate scattered small cortical hypodense cysts, no further imaging follow-up recommended. No renal obstruction or hydronephrosis. No hydroureter or ureteral calculus. Bladder unremarkable. Stomach/Bowel: Negative for bowel obstruction, significant dilatation, ileus, or free air. Normal appendix in the right lower quadrant. Minor lower abdomen fluid distended small bowel with a few scattered air-fluid levels can be seen with ongoing diarrhea. No definite bowel wall thickening. No free fluid, fluid collection, hemorrhage, hematoma, abscess, or ascites. Vascular/Lymphatic: Distal aortic minor atherosclerosis. No aneurysm or dissection. No occlusive process. Mesenteric and renal vasculature appear patent. No veno-occlusive finding. No bulky adenopathy. Reproductive: No significant finding by CT. Other: No abdominal wall hernia or abnormality. No abdominopelvic ascites. Musculoskeletal: No acute or significant osseous findings. IMPRESSION: No acute intra-abdominal or pelvic finding by CT. Normal appendix Aortic Atherosclerosis (ICD10-I70.0). Electronically Signed   By: MJerilynn Mages  Shick M.D.   On: 04/22/2021 12:23   ? ?PROCEDURES and INTERVENTIONS: ? ?.1-3 Lead EKG Interpretation ?Performed by: SVladimir Crofts MD ?Authorized by: SVladimir Crofts MD  ? ?  Interpretation: abnormal   ?  ECG rate:  120 ?  ECG rate assessment: tachycardic   ?  Rhythm: sinus tachycardia   ?  Ectopy: none   ? ?Medications  ?lactated ringers bolus 1,000 mL (0 mLs Intravenous Stopped 04/22/21 1220)  ?iohexol (OMNIPAQUE) 300 MG/ML solution 100 mL (100 mLs Intravenous Contrast Given 04/22/21 1205)  ? ? ? ?IMPRESSION / MDM / ASSESSMENT AND PLAN / ED COURSE  ?I reviewed the triage vital signs and the nursing notes. ? ?46year old male presents to the ED with recurrent GI symptoms, likely gastroenteritis and ultimately suitable for outpatient management.  Tachycardic, improving with IV fluids and otherwise hemodynamically stable.   Blood work with a mild leukocytosis.  Normal lipase and reassuring metabolic panel.  Urine without infectious features, but with ketonuria suggestive of dehydration with his significant fluid output.  Due to his tachycardia and leukocytosis, CT abdomen/pelvis obtained and without evidence of acute appendicitis, cholecystitis, SBO or more severe acute intra-abdominal pathology.  He is tolerating p.o. intake and looks better after fluid resuscitation.  We discussed return precautions for the ED and he is suitable for outpatient management. ? ?Clinical Course as of 04/22/21 1331  ?Thu Apr 22, 2021  ?1255 Reassessed.  Feeling better.  Tachycardia resolved.  We discussed reassuring CT scan and possible etiologies of his symptoms.  We discussed Zofran at home and return precautions for the ED. [DS]  ?  ?Clinical Course User Index ?[DS] SVladimir Crofts MD  ? ? ? ?FINAL CLINICAL IMPRESSION(S) / ED DIAGNOSES  ? ?Final diagnoses:  ?Nausea vomiting and diarrhea  ?Dehydration  ? ? ? ?Rx / DC Orders  ? ?ED Discharge Orders   ? ?      Ordered  ?  ondansetron (ZOFRAN-ODT) 4 MG  disintegrating tablet  Every 8 hours PRN       ? 04/22/21 1256  ? ?  ?  ? ?  ? ? ? ?Note:  This document was prepared using Dragon voice recognition software and may include unintentional dictation errors. ?  ?Vladimir Crofts, MD ?04/22/21 1333 ? ?

## 2021-04-22 NOTE — ED Triage Notes (Signed)
Pt comes into the ED via POV c/o generalized abd pain with N/V/D that started last night at 2:00am.  Pt explains the abd pain feels like a "twisting" of his stomach.  Pt ambulatory with even and unlabored respirations.  ?

## 2021-04-22 NOTE — Telephone Encounter (Signed)
?  Chief Complaint: Vomiting/diarrhea//abdominal pain ?Symptoms: IBID ?Frequency: 2-2:30 this morning ?Pertinent Negatives: Patient denies Fever, blood or "coffee grounds" in vomit ?Disposition: '[x]'$ ED /'[]'$ Urgent Care (no appt availability in office) / '[]'$ Appointment(In office/virtual)/ '[]'$  [facility]/ '[]'$ Home Care/ '[]'$ Refused Recommended Disposition /'[]'$  Mobile Bus/ '[]'$  Follow-up with PCP ?Additional Notes: Pt started with Vomiting diarrhea and abdominal pain at 2am. Pt has vomited at least 8 times, and at least as many bout of diarrhea.  Pain is severe, but intermittent. ? ? ?Reason for Disposition ? [1] SEVERE vomiting (e.g., 6 or more times/day) AND [2] present > 8 hours (Exception: patient sounds well, is drinking liquids, does not sound dehydrated, and vomiting has lasted less than 24 hours) ? ?Answer Assessment - Initial Assessment Questions ?1. VOMITING SEVERITY: "How many times have you vomited in the past 24 hours?"  ?   - MILD:  1 - 2 times/day ?   - MODERATE: 3 - 5 times/day, decreased oral intake without significant weight loss or symptoms of dehydration ?   - SEVERE: 6 or more times/day, vomits everything or nearly everything, with significant weight loss, symptoms of dehydration  ?    severe ?2. ONSET: "When did the vomiting begin?"  ?    2-2:30 am ?3. FLUIDS: "What fluids or food have you vomited up today?" "Have you been able to keep any fluids down?" ?    no ?4. ABDOMINAL PAIN: "Are your having any abdominal pain?" If yes : "How bad is it and what does it feel like?" (e.g., crampy, dull, intermittent, constant)  ?    Sharp - intermittent ?5. DIARRHEA: "Is there any diarrhea?" If Yes, ask: "How many times today?"  ?    More than 8 ?6. CONTACTS: "Is there anyone else in the family with the same symptoms?"  ?    no ?7. CAUSE: "What do you think is causing your vomiting?" ?    unknown ?8. HYDRATION STATUS: "Any signs of dehydration?" (e.g., dry mouth [not only dry lips], too weak to  stand) "When did you last urinate?" ?    Dry mouth ?9. OTHER SYMPTOMS: "Do you have any other symptoms?" (e.g., fever, headache, vertigo, vomiting blood or coffee grounds, recent head injury) ?    no ?10. PREGNANCY: "Is there any chance you are pregnant?" "When was your last menstrual period?" ?      na ? ?Protocols used: Vomiting-A-AH ? ?

## 2021-04-28 ENCOUNTER — Telehealth: Payer: Self-pay | Admitting: *Deleted

## 2021-04-28 NOTE — Telephone Encounter (Signed)
Transition Care Management Unsuccessful Follow-up Telephone Call ? ?Date of discharge and from where:  [facility] 04-21-2021 ? ?Attempts:  1st Attempt ? ?Reason for unsuccessful TCM follow-up call:  Left voice message ? ?  ?

## 2021-04-29 NOTE — Telephone Encounter (Signed)
Transition Care Management Unsuccessful Follow-up Telephone Call  Date of discharge and from where:  [facility]   Attempts:  2nd Attempt  Reason for unsuccessful TCM follow-up call:  Left voice message      

## 2021-07-23 NOTE — Progress Notes (Unsigned)
There were no vitals taken for this visit.   Subjective:    Patient ID: Ralph Lawson, male    DOB: Apr 23, 1975, 46 y.o.   MRN: [id number]  HPI: Ralph Lawson is a 46 y.o. male presenting on 07/26/2021 for comprehensive medical examination. Current medical complaints include:{Blank single:19197::"none","***"}  He currently lives with: Interim Problems from his last visit: {Blank single:19197::"yes","no"}  OCD Patient states his OCD is doing well.  Denies concerns at visit today.  Feels like the Celexa is working well for him.  Denies anxiety/Depression.  SMOKING CESSATION Smoking Status: still smoking Smoking Amount: just under 1 ppd Smoking Onset:  Smoking Quit Date: wants to quit smoking but doesn't have a date yet. Smoking triggers:  Depression Screen done today and results listed below:     01/18/2021    1:39 PM 07/08/2020    8:22 AM 05/29/2020    9:56 AM  Depression screen PHQ 2/9  Decreased Interest 0 0 0  Down, Depressed, Hopeless 0 0 0  PHQ - 2 Score 0 0 0  Altered sleeping 0 1   Tired, decreased energy 1 1   Change in appetite 1 0   Feeling bad or failure about yourself  0 0   Trouble concentrating 1 0   Moving slowly or fidgety/restless 0 0   Suicidal thoughts 0 0   PHQ-9 Score 3 2   Difficult doing work/chores Not difficult at all Not difficult at all     The patient {has/does not have:19849} a history of falls. I {did/did not:19850} complete a risk assessment for falls. A plan of care for falls {was/was not:19852} documented.   Past Medical History:  Past Medical History:  Diagnosis Date   OCD (obsessive compulsive disorder)     Surgical History:  Past Surgical History:  Procedure Laterality Date   collapsed lung     COLONOSCOPY WITH PROPOFOL N/A 03/15/2021   Procedure: COLONOSCOPY WITH PROPOFOL;  Surgeon: Jonathon Bellows, MD;  Location: [facility] ENDOSCOPY;  Service: Gastroenterology;  Laterality: N/A;   EYE SURGERY     R K Surgery    Medications:  Current  Outpatient Medications on File Prior to Visit  Medication Sig   citalopram (CELEXA) 20 MG tablet Take 1 tablet (20 mg total) by mouth daily.   clobetasol cream (TEMOVATE) 1.44 % Apply 1 application topically 2 (two) times daily.   ondansetron (ZOFRAN-ODT) 4 MG disintegrating tablet Take 1 tablet (4 mg total) by mouth every 8 (eight) hours as needed.   rosuvastatin (CRESTOR) 10 MG tablet Take 0.5 tablets (5 mg total) by mouth daily.   No current facility-administered medications on file prior to visit.    Allergies:  No Known Allergies  Social History:  Social History   Socioeconomic History   Marital status: Married    Spouse name: Not on file   Number of children: Not on file   Years of education: Not on file   Highest education level: Not on file  Occupational History   Not on file  Tobacco Use   Smoking status: Some Days    Packs/day: 1.00    Years: 30.00    Total pack years: 30.00    Types: Cigarettes    Start date: 03/30/1990   Smokeless tobacco: Never  Vaping Use   Vaping Use: Some days  Substance and Sexual Activity   Alcohol use: Yes    Alcohol/week: 3.0 standard drinks of alcohol    Types: 3 Cans of beer per week  Drug use: Yes    Types: Marijuana    Comment: 03/13/21   Sexual activity: Yes    Birth control/protection: None  Other Topics Concern   Not on file  Social History Narrative   Not on file   Social Determinants of Health   Financial Resource Strain: Not on file  Food Insecurity: Not on file  Transportation Needs: Not on file  Physical Activity: Not on file  Stress: Not on file  Social Connections: Not on file  Intimate Partner Violence: Not on file   Social History   Tobacco Use  Smoking Status Some Days   Packs/day: 1.00   Years: 30.00   Total pack years: 30.00   Types: Cigarettes   Start date: 03/30/1990  Smokeless Tobacco Never   Social History   Substance and Sexual Activity  Alcohol Use Yes   Alcohol/week: 3.0 standard  drinks of alcohol   Types: 3 Cans of beer per week    Family History:  Family History  Problem Relation Age of Onset   Cancer Mother        Breast   Cancer Father        Skin   Autism Son    Dementia Maternal Grandmother    Cancer Maternal Grandfather        Colon   Alzheimer's disease Maternal Grandfather    Alcohol abuse Maternal Grandfather    Hypertension Paternal Grandmother    Hypertension Paternal Grandfather    Diabetes Paternal Grandfather     Past medical history, surgical history, medications, allergies, family history and social history reviewed with patient today and changes made to appropriate areas of the chart.   ROS All other ROS negative except what is listed above and in the HPI.      Objective:    There were no vitals taken for this visit.  Wt Readings from Last 3 Encounters:  04/22/21 184 lb 15.5 oz (83.9 kg)  03/15/21 185 lb (83.9 kg)  02/05/21 183 lb 9.6 oz (83.3 kg)    Physical Exam  Results for orders placed or performed during the hospital encounter of 04/22/21  Lipase, blood  Result Value Ref Range   Lipase 28 11 - 51 U/L  Comprehensive metabolic panel  Result Value Ref Range   Sodium 134 (L) 135 - 145 mmol/L   Potassium 4.0 3.5 - 5.1 mmol/L   Chloride 102 98 - 111 mmol/L   CO2 24 22 - 32 mmol/L   Glucose, Bld 113 (H) 70 - 99 mg/dL   BUN 15 6 - 20 mg/dL   Creatinine, Ser 0.78 0.61 - 1.24 mg/dL   Calcium 9.4 8.9 - 10.3 mg/dL   Total Protein 8.3 (H) 6.5 - 8.1 g/dL   Albumin 5.2 (H) 3.5 - 5.0 g/dL   AST 20 15 - 41 U/L   ALT 22 0 - 44 U/L   Alkaline Phosphatase 61 38 - 126 U/L   Total Bilirubin 1.1 0.3 - 1.2 mg/dL   GFR, Estimated >60 >60 mL/min   Anion gap 8 5 - 15  CBC  Result Value Ref Range   WBC 11.9 (H) 4.0 - 10.5 K/uL   RBC 5.40 4.22 - 5.81 MIL/uL   Hemoglobin 16.5 13.0 - 17.0 g/dL   HCT 48.9 39.0 - 52.0 %   MCV 90.6 80.0 - 100.0 fL   MCH 30.6 26.0 - 34.0 pg   MCHC 33.7 30.0 - 36.0 g/dL   RDW 13.0 11.5 - 15.5 %  Platelets 363 150 - 400 K/uL   nRBC 0.0 0.0 - 0.2 %  Urinalysis, Routine w reflex microscopic Urine, Clean Catch  Result Value Ref Range   Color, Urine YELLOW (A) YELLOW   APPearance CLEAR (A) CLEAR   Specific Gravity, Urine 1.027 1.005 - 1.030   pH 5.0 5.0 - 8.0   Glucose, UA NEGATIVE NEGATIVE mg/dL   Hgb urine dipstick MODERATE (A) NEGATIVE   Bilirubin Urine NEGATIVE NEGATIVE   Ketones, ur 20 (A) NEGATIVE mg/dL   Protein, ur NEGATIVE NEGATIVE mg/dL   Nitrite NEGATIVE NEGATIVE   Leukocytes,Ua NEGATIVE NEGATIVE   RBC / HPF 0-5 0 - 5 RBC/hpf   WBC, UA 0-5 0 - 5 WBC/hpf   Bacteria, UA FEW (A) NONE SEEN   Squamous Epithelial / LPF NONE SEEN 0 - 5   Mucus PRESENT       Assessment & Plan:   Problem List Items Addressed This Visit       Other   OCD (obsessive compulsive disorder)   Tobacco abuse - Primary     Discussed aspirin prophylaxis for myocardial infarction prevention and decision was {Blank single:19197::"it was not indicated","made to continue ASA","made to start ASA","made to stop ASA","that we recommended ASA, and patient refused"}  LABORATORY TESTING:  Health maintenance labs ordered today as discussed above.   The natural history of prostate cancer and ongoing controversy regarding screening and potential treatment outcomes of prostate cancer has been discussed with the patient. The meaning of a false positive PSA and a false negative PSA has been discussed. He indicates understanding of the limitations of this screening test and wishes *** to proceed with screening PSA testing.   IMMUNIZATIONS:   - Tdap: Tetanus vaccination status reviewed: {tetanus status:315746}. - Influenza: {Blank single:19197::"Up to date","Administered today","Postponed to flu season","Refused","Given elsewhere"} - Pneumovax: {Blank single:19197::"Up to date","Administered today","Not applicable","Refused","Given elsewhere"} - Prevnar: {Blank single:19197::"Up to date","Administered  today","Not applicable","Refused","Given elsewhere"} - COVID: {Blank single:19197::"Up to date","Administered today","Not applicable","Refused","Given elsewhere"} - HPV: {Blank single:19197::"Up to date","Administered today","Not applicable","Refused","Given elsewhere"} - Shingrix vaccine: {Blank single:19197::"Up to date","Administered today","Not applicable","Refused","Given elsewhere"}  SCREENING: - Colonoscopy: {Blank single:19197::"Up to date","Ordered today","Not applicable","Refused","Done elsewhere"}  Discussed with patient purpose of the colonoscopy is to detect colon cancer at curable precancerous or early stages   - AAA Screening: {Blank single:19197::"Up to date","Ordered today","Not applicable","Refused","Done elsewhere"}  -Hearing Test: {Blank single:19197::"Up to date","Ordered today","Not applicable","Refused","Done elsewhere"}  -Spirometry: {Blank single:19197::"Up to date","Ordered today","Not applicable","Refused","Done elsewhere"}   PATIENT COUNSELING:    Sexuality: Discussed sexually transmitted diseases, partner selection, use of condoms, avoidance of unintended pregnancy  and contraceptive alternatives.   Advised to avoid cigarette smoking.  I discussed with the patient that most people either abstain from alcohol or drink within safe limits (<=14/week and <=4 drinks/occasion for males, <=7/weeks and <= 3 drinks/occasion for females) and that the risk for alcohol disorders and other health effects rises proportionally with the number of drinks per week and how often a drinker exceeds daily limits.  Discussed cessation/primary prevention of drug use and availability of treatment for abuse.   Diet: Encouraged to adjust caloric intake to maintain  or achieve ideal body weight, to reduce intake of dietary saturated fat and total fat, to limit sodium intake by avoiding high sodium foods and not adding table salt, and to maintain adequate dietary potassium and calcium  preferably from fresh fruits, vegetables, and low-fat dairy products.    stressed the importance of regular exercise  Injury prevention: Discussed safety belts, safety helmets, smoke detector,  smoking near bedding or upholstery.   Dental health: Discussed importance of regular tooth brushing, flossing, and dental visits.   Follow up plan: NEXT PREVENTATIVE PHYSICAL DUE IN 1 YEAR. No follow-ups on file.

## 2021-07-26 ENCOUNTER — Encounter: Payer: Self-pay | Admitting: Nurse Practitioner

## 2021-07-26 ENCOUNTER — Ambulatory Visit (INDEPENDENT_AMBULATORY_CARE_PROVIDER_SITE_OTHER): Payer: BC Managed Care – PPO | Admitting: Nurse Practitioner

## 2021-07-26 VITALS — BP 119/80 | HR 88 | Temp 99.1°F | Ht 73.72 in | Wt 180.8 lb

## 2021-07-26 DIAGNOSIS — Z136 Encounter for screening for cardiovascular disorders: Secondary | ICD-10-CM

## 2021-07-26 DIAGNOSIS — Z0000 Encounter for general adult medical examination without abnormal findings: Secondary | ICD-10-CM

## 2021-07-26 DIAGNOSIS — Z720 Tobacco use: Secondary | ICD-10-CM

## 2021-07-26 DIAGNOSIS — F429 Obsessive-compulsive disorder, unspecified: Secondary | ICD-10-CM | POA: Diagnosis not present

## 2021-07-26 LAB — URINALYSIS, ROUTINE W REFLEX MICROSCOPIC
Bilirubin, UA: NEGATIVE
Glucose, UA: NEGATIVE
Ketones, UA: NEGATIVE
Leukocytes,UA: NEGATIVE
Nitrite, UA: NEGATIVE
Protein,UA: NEGATIVE
Specific Gravity, UA: 1.015 (ref 1.005–1.030)
Urobilinogen, Ur: 0.2 mg/dL (ref 0.2–1.0)
pH, UA: 7 (ref 5.0–7.5)

## 2021-07-26 LAB — MICROSCOPIC EXAMINATION
Bacteria, UA: NONE SEEN
Epithelial Cells (non renal): NONE SEEN /hpf (ref 0–10)
WBC, UA: NONE SEEN /hpf (ref 0–5)

## 2021-07-26 MED ORDER — CITALOPRAM HYDROBROMIDE 40 MG PO TABS: 40.0000 mg | ORAL_TABLET | Freq: Every day | ORAL | 1 refills | Status: DC

## 2021-07-26 NOTE — Assessment & Plan Note (Signed)
Chronic. Ongoing.  Still has the desire to quit.  Will work to get OCD under control then re discuss smoking cessation.

## 2021-07-26 NOTE — Assessment & Plan Note (Signed)
Chronic. Not well controlled.  Will increase Celexa to '40mg'$  daily.  Follow up in 6 weeks. Call sooner if concerns arise.

## 2021-07-27 LAB — COMPREHENSIVE METABOLIC PANEL
ALT: 16 IU/L (ref 0–44)
AST: 20 IU/L (ref 0–40)
Albumin/Globulin Ratio: 2.2 (ref 1.2–2.2)
Albumin: 5 g/dL (ref 4.1–5.1)
Alkaline Phosphatase: 65 IU/L (ref 44–121)
BUN/Creatinine Ratio: 10 (ref 9–20)
BUN: 8 mg/dL (ref 6–24)
Bilirubin Total: 0.5 mg/dL (ref 0.0–1.2)
CO2: 24 mmol/L (ref 20–29)
Calcium: 10.2 mg/dL (ref 8.7–10.2)
Chloride: 100 mmol/L (ref 96–106)
Creatinine, Ser: 0.82 mg/dL (ref 0.76–1.27)
Globulin, Total: 2.3 g/dL (ref 1.5–4.5)
Glucose: 100 mg/dL — ABNORMAL HIGH (ref 70–99)
Potassium: 4.5 mmol/L (ref 3.5–5.2)
Sodium: 138 mmol/L (ref 134–144)
Total Protein: 7.3 g/dL (ref 6.0–8.5)
eGFR: 110 mL/min/{1.73_m2} (ref >59)

## 2021-07-27 LAB — CBC WITH DIFFERENTIAL/PLATELET
Basophils Absolute: 0 10*3/uL (ref 0.0–0.2)
Basos: 1 %
EOS (ABSOLUTE): 0.1 10*3/uL (ref 0.0–0.4)
Eos: 1 %
Hematocrit: 45.9 % (ref 37.5–51.0)
Hemoglobin: 15.8 g/dL (ref 13.0–17.7)
Immature Grans (Abs): 0 10*3/uL (ref 0.0–0.1)
Immature Granulocytes: 0 %
Lymphocytes Absolute: 2.3 10*3/uL (ref 0.7–3.1)
Lymphs: 28 %
MCH: 31.7 pg (ref 26.6–33.0)
MCHC: 34.4 g/dL (ref 31.5–35.7)
MCV: 92 fL (ref 79–97)
Monocytes Absolute: 0.7 10*3/uL (ref 0.1–0.9)
Monocytes: 8 %
Neutrophils Absolute: 5.1 10*3/uL (ref 1.4–7.0)
Neutrophils: 62 %
Platelets: 370 10*3/uL (ref 150–450)
RBC: 4.98 x10E6/uL (ref 4.14–5.80)
RDW: 12.8 % (ref 11.6–15.4)
WBC: 8.2 10*3/uL (ref 3.4–10.8)

## 2021-07-27 LAB — LIPID PANEL
Chol/HDL Ratio: 3.9 ratio (ref 0.0–5.0)
Cholesterol, Total: 220 mg/dL — ABNORMAL HIGH (ref 100–199)
HDL: 56 mg/dL (ref >39)
LDL Chol Calc (NIH): 133 mg/dL — ABNORMAL HIGH (ref 0–99)
Triglycerides: 174 mg/dL — ABNORMAL HIGH (ref 0–149)
VLDL Cholesterol Cal: 31 mg/dL (ref 5–40)

## 2021-07-27 LAB — TSH: TSH: 2.94 u[IU]/mL (ref 0.450–4.500)

## 2021-07-27 NOTE — Progress Notes (Signed)
Hi Ralph Lawson.  It was nice to see you yesterday.  Your cholesterol is elevated from prior.  However, your cardiac risk score is in the moderate risk range.  At this point, I would work on quitting smoking which would reduce your risk more and we will work on the diet portion after that.  Otherwise, your blood work looks great.  No other concerns at this time.  The 10-year ASCVD risk score (Arnett DK, et al., 2019) is: 5.3%   Values used to calculate the score:     Age: 46 years     Sex: Male     Is Non-Hispanic African American: No     Diabetic: No     Tobacco smoker: Yes     Systolic Blood Pressure: 838 mmHg     Is BP treated: No     HDL Cholesterol: 56 mg/dL     Total Cholesterol: 220 mg/dL

## 2021-08-06 ENCOUNTER — Encounter: Payer: Self-pay | Admitting: Nurse Practitioner

## 2021-08-09 MED ORDER — SILDENAFIL CITRATE 50 MG PO TABS: 50.0000 mg | ORAL_TABLET | Freq: Every day | ORAL | 2 refills | Status: DC | PRN

## 2021-09-06 NOTE — Progress Notes (Unsigned)
There were no vitals taken for this visit.   Subjective:    Patient ID: Ralph Lawson, male    DOB: Aug 24, 1975, 46 y.o.   MRN: [id number]  HPI: Ralph Lawson is a 46 y.o. male  No chief complaint on file.  OCD   Relevant past medical, surgical, family and social history reviewed and updated as indicated. Interim medical history since our last visit reviewed. Allergies and medications reviewed and updated.  Review of Systems  Per HPI unless specifically indicated above     Objective:    There were no vitals taken for this visit.  Wt Readings from Last 3 Encounters:  07/26/21 180 lb 12.8 oz (82 kg)  04/22/21 184 lb 15.5 oz (83.9 kg)  03/15/21 185 lb (83.9 kg)    Physical Exam  Results for orders placed or performed in visit on 07/26/21  Microscopic Examination   Urine  Result Value Ref Range   WBC, UA None seen 0 - 5 /hpf   RBC, Urine 3-10 (A) 0 - 2 /hpf   Epithelial Cells (non renal) None seen 0 - 10 /hpf   Bacteria, UA None seen None seen/Few  TSH  Result Value Ref Range   TSH 2.940 0.450 - 4.500 uIU/mL  Lipid panel  Result Value Ref Range   Cholesterol, Total 220 (H) 100 - 199 mg/dL   Triglycerides 174 (H) 0 - 149 mg/dL   HDL 56 >39 mg/dL   VLDL Cholesterol Cal 31 5 - 40 mg/dL   LDL Chol Calc (NIH) 133 (H) 0 - 99 mg/dL   Chol/HDL Ratio 3.9 0.0 - 5.0 ratio  CBC with Differential/Platelet  Result Value Ref Range   WBC 8.2 3.4 - 10.8 x10E3/uL   RBC 4.98 4.14 - 5.80 x10E6/uL   Hemoglobin 15.8 13.0 - 17.7 g/dL   Hematocrit 45.9 37.5 - 51.0 %   MCV 92 79 - 97 fL   MCH 31.7 26.6 - 33.0 pg   MCHC 34.4 31.5 - 35.7 g/dL   RDW 12.8 11.6 - 15.4 %   Platelets 370 150 - 450 x10E3/uL   Neutrophils 62 Not Estab. %   Lymphs 28 Not Estab. %   Monocytes 8 Not Estab. %   Eos 1 Not Estab. %   Basos 1 Not Estab. %   Neutrophils Absolute 5.1 1.4 - 7.0 x10E3/uL   Lymphocytes Absolute 2.3 0.7 - 3.1 x10E3/uL   Monocytes Absolute 0.7 0.1 - 0.9 x10E3/uL   EOS (ABSOLUTE) 0.1 0.0  - 0.4 x10E3/uL   Basophils Absolute 0.0 0.0 - 0.2 x10E3/uL   Immature Granulocytes 0 Not Estab. %   Immature Grans (Abs) 0.0 0.0 - 0.1 x10E3/uL  Comprehensive metabolic panel  Result Value Ref Range   Glucose 100 (H) 70 - 99 mg/dL   BUN 8 6 - 24 mg/dL   Creatinine, Ser 0.82 0.76 - 1.27 mg/dL   eGFR 110 >59 mL/min/1.73   BUN/Creatinine Ratio 10 9 - 20   Sodium 138 134 - 144 mmol/L   Potassium 4.5 3.5 - 5.2 mmol/L   Chloride 100 96 - 106 mmol/L   CO2 24 20 - 29 mmol/L   Calcium 10.2 8.7 - 10.2 mg/dL   Total Protein 7.3 6.0 - 8.5 g/dL   Albumin 5.0 4.1 - 5.1 g/dL   Globulin, Total 2.3 1.5 - 4.5 g/dL   Albumin/Globulin Ratio 2.2 1.2 - 2.2   Bilirubin Total 0.5 0.0 - 1.2 mg/dL   Alkaline Phosphatase 65 44 - 121 IU/L  AST 20 0 - 40 IU/L   ALT 16 0 - 44 IU/L  Urinalysis, Routine w reflex microscopic  Result Value Ref Range   Specific Gravity, UA 1.015 1.005 - 1.030   pH, UA 7.0 5.0 - 7.5   Color, UA Yellow Yellow   Appearance Ur Clear Clear   Leukocytes,UA Negative Negative   Protein,UA Negative Negative/Trace   Glucose, UA Negative Negative   Ketones, UA Negative Negative   RBC, UA Trace (A) Negative   Bilirubin, UA Negative Negative   Urobilinogen, Ur 0.2 0.2 - 1.0 mg/dL   Nitrite, UA Negative Negative   Microscopic Examination See below:       Assessment & Plan:   Problem List Items Addressed This Visit   None    Follow up plan: No follow-ups on file.

## 2021-09-07 ENCOUNTER — Encounter: Payer: Self-pay | Admitting: Nurse Practitioner

## 2021-09-07 ENCOUNTER — Ambulatory Visit (INDEPENDENT_AMBULATORY_CARE_PROVIDER_SITE_OTHER): Payer: BC Managed Care – PPO | Admitting: Nurse Practitioner

## 2021-09-07 VITALS — BP 130/82 | HR 83 | Temp 99.1°F | Wt 179.4 lb

## 2021-09-07 DIAGNOSIS — F429 Obsessive-compulsive disorder, unspecified: Secondary | ICD-10-CM | POA: Diagnosis not present

## 2021-09-07 DIAGNOSIS — Z720 Tobacco use: Secondary | ICD-10-CM | POA: Diagnosis not present

## 2021-09-07 NOTE — Assessment & Plan Note (Signed)
Chronic. Improved from prior.  Feels like increasing the dose was a good move for him. Will continue with Celexa '40mg'$  daily.  Follow up in 3 months.  Call sooner if concerns arise.

## 2021-09-07 NOTE — Assessment & Plan Note (Signed)
Chronic. Has not had a cigarette in 2.5 weeks.  Has been vaping.  However, he plans to cut that down drastically.  Will follow up in 3 months.  Call sooner if concerns arise.

## 2021-10-31 ENCOUNTER — Other Ambulatory Visit: Payer: Self-pay | Admitting: Nurse Practitioner

## 2021-11-02 NOTE — Telephone Encounter (Signed)
Rx 07/26/21 #90 1RF- too soon Requested Prescriptions  Pending Prescriptions Disp Refills  . citalopram (CELEXA) 20 MG tablet [Pharmacy Med Name: Citalopram Hydrobromide 20 MG Oral Tablet] 90 tablet 0    Sig: Take 1 tablet by mouth once daily     Psychiatry:  Antidepressants - SSRI Passed - 10/31/2021  2:12 PM      Passed - Valid encounter within last 6 months    Recent Outpatient Visits          1 month ago Obsessive-compulsive disorder, unspecified type   [facility] Jon Billings, NP   3 months ago Annual physical exam   [place], NP   9 months ago Rectal bleeding   [place], Megan P, DO   9 months ago Obsessive-compulsive disorder, unspecified type   [facility] Jon Billings, NP   10 months ago Body aches   [place], Lauren A, NP      Future Appointments            In 1 month Jon Billings, NP [facility], [place]

## 2021-12-07 NOTE — Progress Notes (Unsigned)
There were no vitals taken for this visit.   Subjective:    Patient ID: Ralph Lawson, male    DOB: April 23, 1975, 46 y.o.   MRN: [id number]  HPI: Ralph Lawson is a 45 y.o. male  No chief complaint on file.  OCD Patient states he is doing much better.  He did miss 4 days of his medication which is why is scores are higher than he would like.  He is back on the medication and feeling better.  Not having the medication made him realize he does need the medication.     [place] Office Visit from 09/07/2021 in [place]  PHQ-9 Total Score 10         09/07/2021    8:24 AM 07/26/2021    8:10 AM 01/18/2021    1:39 PM 07/08/2020    8:23 AM  GAD 7 : Generalized Anxiety Score  Nervous, Anxious, on Edge _0 Control/stop worrying 1 2 0 1  Worry too much - different things 1 2 0 0  Trouble relaxing _1 0  Restless _2 0  Easily annoyed or irritable 2 1 0 1  Afraid - awful might happen 1 2 0 0  Total GAD 7 Score _3 Anxiety Difficulty Very difficult Very difficult Not difficult at all Not difficult at all   TOBACCO USE Patient states he has been vaping.  He has not had a cigarette in 2.5 weeks.  He plans to wean down.    Relevant past medical, surgical, family and social history reviewed and updated as indicated. Interim medical history since our last visit reviewed. Allergies and medications reviewed and updated.  Review of Systems  Psychiatric/Behavioral:  Positive for dysphoric mood. Negative for suicidal ideas. The patient is nervous/anxious.     Per HPI unless specifically indicated above     Objective:    There were no vitals taken for this visit.  Wt Readings from Last 3 Encounters:  09/07/21 179 lb 6.4 oz (81.4 kg)  07/26/21 180 lb 12.8 oz (82 kg)  04/22/21 184 lb 15.5 oz (83.9 kg)    Physical Exam Vitals and nursing note reviewed.  Constitutional:      General: He is not in acute distress.    Appearance: Normal appearance. He is normal  weight. He is not ill-appearing, toxic-appearing or diaphoretic.  HENT:     Head: Normocephalic.     Right Ear: External ear normal.     Left Ear: External ear normal.     Nose: Nose normal. No congestion or rhinorrhea.     Mouth/Throat:     Mouth: Mucous membranes are moist.  Eyes:     General:        Right eye: No discharge.        Left eye: No discharge.     Extraocular Movements: Extraocular movements intact.     Conjunctiva/sclera: Conjunctivae normal.     Pupils: Pupils are equal, round, and reactive to light.  Cardiovascular:     Rate and Rhythm: Normal rate and regular rhythm.     Heart sounds: No murmur heard. Pulmonary:     Effort: Pulmonary effort is normal. No respiratory distress.     Breath sounds: Normal breath sounds. No wheezing, rhonchi or rales.  Abdominal:     General: Abdomen is flat. Bowel sounds are normal.  Musculoskeletal:     Cervical back: Normal range of motion and neck  supple.  Skin:    General: Skin is warm and dry.     Capillary Refill: Capillary refill takes less than 2 seconds.  Neurological:     General: No focal deficit present.     Mental Status: He is alert and oriented to person, place, and time.  Psychiatric:        Mood and Affect: Mood normal.        Behavior: Behavior normal.        Thought Content: Thought content normal.        Judgment: Judgment normal.    Results for orders placed or performed in visit on 07/26/21  Microscopic Examination   Urine  Result Value Ref Range   WBC, UA None seen 0 - 5 /hpf   RBC, Urine 3-10 (A) 0 - 2 /hpf   Epithelial Cells (non renal) None seen 0 - 10 /hpf   Bacteria, UA None seen None seen/Few  TSH  Result Value Ref Range   TSH 2.940 0.450 - 4.500 uIU/mL  Lipid panel  Result Value Ref Range   Cholesterol, Total 220 (H) 100 - 199 mg/dL   Triglycerides 174 (H) 0 - 149 mg/dL   HDL 56 >39 mg/dL   VLDL Cholesterol Cal 31 5 - 40 mg/dL   LDL Chol Calc (NIH) 133 (H) 0 - 99 mg/dL   Chol/HDL  Ratio 3.9 0.0 - 5.0 ratio  CBC with Differential/Platelet  Result Value Ref Range   WBC 8.2 3.4 - 10.8 x10E3/uL   RBC 4.98 4.14 - 5.80 x10E6/uL   Hemoglobin 15.8 13.0 - 17.7 g/dL   Hematocrit 45.9 37.5 - 51.0 %   MCV 92 79 - 97 fL   MCH 31.7 26.6 - 33.0 pg   MCHC 34.4 31.5 - 35.7 g/dL   RDW 12.8 11.6 - 15.4 %   Platelets 370 150 - 450 x10E3/uL   Neutrophils 62 Not Estab. %   Lymphs 28 Not Estab. %   Monocytes 8 Not Estab. %   Eos 1 Not Estab. %   Basos 1 Not Estab. %   Neutrophils Absolute 5.1 1.4 - 7.0 x10E3/uL   Lymphocytes Absolute 2.3 0.7 - 3.1 x10E3/uL   Monocytes Absolute 0.7 0.1 - 0.9 x10E3/uL   EOS (ABSOLUTE) 0.1 0.0 - 0.4 x10E3/uL   Basophils Absolute 0.0 0.0 - 0.2 x10E3/uL   Immature Granulocytes 0 Not Estab. %   Immature Grans (Abs) 0.0 0.0 - 0.1 x10E3/uL  Comprehensive metabolic panel  Result Value Ref Range   Glucose 100 (H) 70 - 99 mg/dL   BUN 8 6 - 24 mg/dL   Creatinine, Ser 0.82 0.76 - 1.27 mg/dL   eGFR 110 >59 mL/min/1.73   BUN/Creatinine Ratio 10 9 - 20   Sodium 138 134 - 144 mmol/L   Potassium 4.5 3.5 - 5.2 mmol/L   Chloride 100 96 - 106 mmol/L   CO2 24 20 - 29 mmol/L   Calcium 10.2 8.7 - 10.2 mg/dL   Total Protein 7.3 6.0 - 8.5 g/dL   Albumin 5.0 4.1 - 5.1 g/dL   Globulin, Total 2.3 1.5 - 4.5 g/dL   Albumin/Globulin Ratio 2.2 1.2 - 2.2   Bilirubin Total 0.5 0.0 - 1.2 mg/dL   Alkaline Phosphatase 65 44 - 121 IU/L   AST 20 0 - 40 IU/L   ALT 16 0 - 44 IU/L  Urinalysis, Routine w reflex microscopic  Result Value Ref Range   Specific Gravity, UA 1.015 1.005 - 1.030  pH, UA 7.0 5.0 - 7.5   Color, UA Yellow Yellow   Appearance Ur Clear Clear   Leukocytes,UA Negative Negative   Protein,UA Negative Negative/Trace   Glucose, UA Negative Negative   Ketones, UA Negative Negative   RBC, UA Trace (A) Negative   Bilirubin, UA Negative Negative   Urobilinogen, Ur 0.2 0.2 - 1.0 mg/dL   Nitrite, UA Negative Negative   Microscopic Examination See below:        Assessment & Plan:   Problem List Items Addressed This Visit       Other   OCD (obsessive compulsive disorder) - Primary   Tobacco abuse     Follow up plan: No follow-ups on file.

## 2021-12-08 ENCOUNTER — Encounter: Payer: Self-pay | Admitting: Nurse Practitioner

## 2021-12-08 ENCOUNTER — Ambulatory Visit: Payer: BC Managed Care – PPO | Admitting: Nurse Practitioner

## 2021-12-08 VITALS — BP 130/73 | HR 99 | Temp 98.4°F | Ht 73.5 in | Wt 188.3 lb

## 2021-12-08 DIAGNOSIS — Z720 Tobacco use: Secondary | ICD-10-CM

## 2021-12-08 DIAGNOSIS — E782 Mixed hyperlipidemia: Secondary | ICD-10-CM | POA: Insufficient documentation

## 2021-12-08 DIAGNOSIS — F429 Obsessive-compulsive disorder, unspecified: Secondary | ICD-10-CM

## 2021-12-08 DIAGNOSIS — Z23 Encounter for immunization: Secondary | ICD-10-CM

## 2021-12-08 DIAGNOSIS — R7309 Other abnormal glucose: Secondary | ICD-10-CM | POA: Insufficient documentation

## 2021-12-08 DIAGNOSIS — M542 Cervicalgia: Secondary | ICD-10-CM

## 2021-12-08 MED ORDER — SILDENAFIL CITRATE 50 MG PO TABS: 50.0000 mg | ORAL_TABLET | Freq: Every day | ORAL | 2 refills | Status: DC | PRN

## 2021-12-08 MED ORDER — CITALOPRAM HYDROBROMIDE 40 MG PO TABS
40.0000 mg | ORAL_TABLET | Freq: Every day | ORAL | 1 refills | Status: DC
Start: 2021-12-08 — End: 2023-04-19

## 2021-12-08 NOTE — Assessment & Plan Note (Signed)
Chronic, stable. Pt states last dose increase to '40mg'$  has worked well for him, less intrusive thoughts and no impedence in normal daily activities/life.  No changes today.

## 2021-12-08 NOTE — Assessment & Plan Note (Signed)
No acute deformities or concerns noted.  Pt denies dysphagia and dyspnea.  Suggested warm compress following work-day to lessen muscle tension and taking OTC ibuprofen for pain relief.  Pt to f/u if pain worsens and ROM is limited.

## 2021-12-08 NOTE — Assessment & Plan Note (Addendum)
Chronic.  Pt continues to abstain from cigarette smoking, now vapes. Currently vapes 1 cartridge/2 week period.  Pt continues to work on lessening his vaping on his journey to complete nicotine cessation. Assessing lipid panel today d/t ASCVD risk score is 6.1%.  Will notify patient of results and necessary lifestyle/diet modifications needed.

## 2021-12-08 NOTE — Assessment & Plan Note (Signed)
Random BG have been > 99 in previous visits. Obtaining A1C today to assess need for lifestyle modifications and diet changes required.  Will f/u with pt as labs result.

## 2021-12-08 NOTE — Assessment & Plan Note (Signed)
Assessing lipid panel today d/t ASCVD risk score is 6.1%. Will notify patient of results and necessary lifestyle/diet modifications needed.

## 2021-12-09 LAB — COMPREHENSIVE METABOLIC PANEL
ALT: 24 IU/L (ref 0–44)
AST: 21 IU/L (ref 0–40)
Albumin/Globulin Ratio: 1.9 (ref 1.2–2.2)
Albumin: 5.1 g/dL (ref 4.1–5.1)
Alkaline Phosphatase: 77 IU/L (ref 44–121)
BUN/Creatinine Ratio: 13 (ref 9–20)
BUN: 11 mg/dL (ref 6–24)
Bilirubin Total: 0.8 mg/dL (ref 0.0–1.2)
CO2: 23 mmol/L (ref 20–29)
Calcium: 9.8 mg/dL (ref 8.7–10.2)
Chloride: 96 mmol/L (ref 96–106)
Creatinine, Ser: 0.84 mg/dL (ref 0.76–1.27)
Globulin, Total: 2.7 g/dL (ref 1.5–4.5)
Glucose: 84 mg/dL (ref 70–99)
Potassium: 4.6 mmol/L (ref 3.5–5.2)
Sodium: 140 mmol/L (ref 134–144)
Total Protein: 7.8 g/dL (ref 6.0–8.5)
eGFR: 109 mL/min/{1.73_m2} (ref >59)

## 2021-12-09 LAB — HEMOGLOBIN A1C
Est. average glucose Bld gHb Est-mCnc: 100 mg/dL
Hgb A1c MFr Bld: 5.1 % (ref 4.8–5.6)

## 2021-12-09 LAB — LIPID PANEL
Chol/HDL Ratio: 3.8 ratio (ref 0.0–5.0)
Cholesterol, Total: 230 mg/dL — ABNORMAL HIGH (ref 100–199)
HDL: 60 mg/dL (ref >39)
LDL Chol Calc (NIH): 137 mg/dL — ABNORMAL HIGH (ref 0–99)
Triglycerides: 189 mg/dL — ABNORMAL HIGH (ref 0–149)
VLDL Cholesterol Cal: 33 mg/dL (ref 5–40)

## 2021-12-09 NOTE — Progress Notes (Signed)
Hi Ralph Lawson. It was nice to see you yesterday.  Your lab work looks good.  Your cholesterol remains elevated.  However, your cardiac risk score is still low.  For now, I recommend a low fat diet and exerciser.  No other concerns at this time. Continue with your current medication regimen.  Follow up as discussed.  Please let me know if you have any questions.

## 2022-02-21 ENCOUNTER — Encounter: Payer: Self-pay | Admitting: Nurse Practitioner

## 2022-06-08 ENCOUNTER — Encounter: Payer: Self-pay | Admitting: Nurse Practitioner

## 2022-06-08 ENCOUNTER — Ambulatory Visit: Payer: BC Managed Care – PPO | Admitting: Nurse Practitioner

## 2022-06-08 VITALS — BP 127/82 | HR 71 | Temp 98.8°F | Wt 192.4 lb

## 2022-06-08 DIAGNOSIS — F429 Obsessive-compulsive disorder, unspecified: Secondary | ICD-10-CM | POA: Diagnosis not present

## 2022-06-08 DIAGNOSIS — R7309 Other abnormal glucose: Secondary | ICD-10-CM | POA: Diagnosis not present

## 2022-06-08 DIAGNOSIS — E782 Mixed hyperlipidemia: Secondary | ICD-10-CM

## 2022-06-08 DIAGNOSIS — I700 Atherosclerosis of aorta: Secondary | ICD-10-CM | POA: Insufficient documentation

## 2022-06-08 NOTE — Assessment & Plan Note (Signed)
Chronic.  Controlled.  Continue with current medication regimen of Celexa 40mg .  Refills sent today.  Labs ordered today.  Return to clinic in 6 months for reevaluation.  Call sooner if concerns arise.

## 2022-06-08 NOTE — Assessment & Plan Note (Signed)
Labs ordered at visit today.  Will make recommendations based on lab results.   

## 2022-06-08 NOTE — Progress Notes (Signed)
BP 127/82   Pulse 71   Temp 98.8 F (37.1 C) (Oral)   Wt 192 lb 6.4 oz (87.3 kg)   SpO2 99%   BMI 25.04 kg/m    Subjective:    Patient ID: Ralph Lawson, male    DOB: [birth year], 47 y.o.   MRN: [id number]  HPI: Ralph Lawson is a 47 y.o. male   Chief Complaint  Patient presents with   Hyperlipidemia   OCD Patient states that dose increase to 40mg  has helped significantly with intrusive thoughts and overall he feels much better.  No complaints today.  TOBACCO USE Patient is currently smoking cigarettes.  He is interested in seeing a psychologist.  He feels like he is drinking more than he would like to.     Flowsheet Row Office Visit from 06/08/2022 in [facility]  PHQ-9 Total Score 5         06/08/2022    8:30 AM 12/08/2021    8:15 AM 09/07/2021    8:24 AM 07/26/2021    8:10 AM  GAD 7 : Generalized Anxiety Score  Nervous, Anxious, on Edge 1 1 1 3   Control/stop worrying 1 0 1 2  Worry too much - different things 1 1 1 2   Trouble relaxing 1 0 2 2  Restless 0 0 1 2  Easily annoyed or irritable 1 1 2 1   Afraid - awful might happen 1 0 1 2  Total GAD 7 Score 6 3 9 14   Anxiety Difficulty  Not difficult at all Very difficult Very difficult      Relevant past medical, surgical, family and social history reviewed and updated as indicated. Interim medical history since our last visit reviewed. Allergies and medications reviewed and updated.  Review of Systems  Constitutional:  Negative for activity change, appetite change and fatigue.  Respiratory:  Negative for cough, choking, chest tightness and shortness of breath.   Cardiovascular:  Negative for chest pain.  Psychiatric/Behavioral:  Negative for decreased concentration, dysphoric mood and suicidal ideas. The patient is not nervous/anxious.     Per HPI unless specifically indicated above     Objective:    BP 127/82   Pulse 71   Temp 98.8 F (37.1 C) (Oral)   Wt 192 lb 6.4 oz (87.3 kg)    SpO2 99%   BMI 25.04 kg/m   Wt Readings from Last 3 Encounters:  06/08/22 192 lb 6.4 oz (87.3 kg)  12/08/21 188 lb 4.8 oz (85.4 kg)  09/07/21 179 lb 6.4 oz (81.4 kg)    Physical Exam Vitals and nursing note reviewed.  Constitutional:      General: He is not in acute distress.    Appearance: Normal appearance. He is not ill-appearing, toxic-appearing or diaphoretic.  HENT:     Head: Normocephalic.     Right Ear: External ear normal.     Left Ear: External ear normal.     Nose: Nose normal. No congestion or rhinorrhea.     Mouth/Throat:     Mouth: Mucous membranes are moist.  Eyes:     General:        Right eye: No discharge.        Left eye: No discharge.     Extraocular Movements: Extraocular movements intact.     Conjunctiva/sclera: Conjunctivae normal.     Pupils: Pupils are equal, round, and reactive to light.  Cardiovascular:     Rate and Rhythm: Normal rate and regular rhythm.  Heart sounds: No murmur heard. Pulmonary:     Effort: Pulmonary effort is normal. No respiratory distress.     Breath sounds: Normal breath sounds. No wheezing, rhonchi or rales.  Abdominal:     General: Abdomen is flat. Bowel sounds are normal.  Musculoskeletal:     Cervical back: Normal range of motion and neck supple.  Skin:    General: Skin is warm and dry.     Capillary Refill: Capillary refill takes less than 2 seconds.  Neurological:     General: No focal deficit present.     Mental Status: He is alert and oriented to person, place, and time.  Psychiatric:        Mood and Affect: Mood normal.        Behavior: Behavior normal.        Thought Content: Thought content normal.        Judgment: Judgment normal.     Results for orders placed or performed in visit on 12/08/21  Comp Met (CMET)  Result Value Ref Range   Glucose 84 70 - 99 mg/dL   BUN 11 6 - 24 mg/dL   Creatinine, Ser 0.86 0.76 - 1.27 mg/dL   eGFR 578 >46 NG/EXB/2.84   BUN/Creatinine Ratio 13 9 - 20   Sodium  140 134 - 144 mmol/L   Potassium 4.6 3.5 - 5.2 mmol/L   Chloride 96 96 - 106 mmol/L   CO2 23 20 - 29 mmol/L   Calcium 9.8 8.7 - 10.2 mg/dL   Total Protein 7.8 6.0 - 8.5 g/dL   Albumin 5.1 4.1 - 5.1 g/dL   Globulin, Total 2.7 1.5 - 4.5 g/dL   Albumin/Globulin Ratio 1.9 1.2 - 2.2   Bilirubin Total 0.8 0.0 - 1.2 mg/dL   Alkaline Phosphatase 77 44 - 121 IU/L   AST 21 0 - 40 IU/L   ALT 24 0 - 44 IU/L  Lipid Profile  Result Value Ref Range   Cholesterol, Total 230 (H) 100 - 199 mg/dL   Triglycerides 132 (H) 0 - 149 mg/dL   HDL 60 >44 mg/dL   VLDL Cholesterol Cal 33 5 - 40 mg/dL   LDL Chol Calc (NIH) 010 (H) 0 - 99 mg/dL   Chol/HDL Ratio 3.8 0.0 - 5.0 ratio  HgB A1c  Result Value Ref Range   Hgb A1c MFr Bld 5.1 4.8 - 5.6 %   Est. average glucose Bld gHb Est-mCnc 100 mg/dL      Assessment & Plan:   Problem List Items Addressed This Visit       Cardiovascular and Mediastinum   Atherosclerosis of aorta (HCC)    Found On CT in 2023.  Discussed with patient during visit today.          Other   OCD (obsessive compulsive disorder)    Chronic.  Controlled.  Continue with current medication regimen of Celexa 40mg .  Refills sent today.  Labs ordered today.  Return to clinic in 6 months for reevaluation.  Call sooner if concerns arise.        Relevant Orders   Ambulatory referral to Psychology   Elevated glucose - Primary    Labs ordered at visit today.  Will make recommendations based on lab results.        Relevant Orders   Comp Met (CMET)   HgB A1c   Mixed hyperlipidemia    Labs ordered at visit today.  Will make recommendations based on lab results.  Relevant Orders   Lipid Profile     Follow up plan: Return in about 6 months (around 12/09/2022) for Physical and Fasting labs.

## 2022-06-08 NOTE — Assessment & Plan Note (Signed)
Found On CT in 2023.  Discussed with patient during visit today.

## 2022-06-09 LAB — COMPREHENSIVE METABOLIC PANEL
ALT: 21 IU/L (ref 0–44)
AST: 18 IU/L (ref 0–40)
Albumin/Globulin Ratio: 2.1 (ref 1.2–2.2)
Albumin: 4.9 g/dL (ref 4.1–5.1)
Alkaline Phosphatase: 67 IU/L (ref 44–121)
BUN/Creatinine Ratio: 15 (ref 9–20)
BUN: 12 mg/dL (ref 6–24)
Bilirubin Total: 0.2 mg/dL (ref 0.0–1.2)
CO2: 23 mmol/L (ref 20–29)
Calcium: 9.8 mg/dL (ref 8.7–10.2)
Chloride: 101 mmol/L (ref 96–106)
Creatinine, Ser: 0.79 mg/dL (ref 0.76–1.27)
Globulin, Total: 2.3 g/dL (ref 1.5–4.5)
Glucose: 79 mg/dL (ref 70–99)
Potassium: 4.6 mmol/L (ref 3.5–5.2)
Sodium: 140 mmol/L (ref 134–144)
Total Protein: 7.2 g/dL (ref 6.0–8.5)
eGFR: 111 mL/min/{1.73_m2} (ref >59)

## 2022-06-09 LAB — LIPID PANEL
Chol/HDL Ratio: 4.7 ratio (ref 0.0–5.0)
Cholesterol, Total: 227 mg/dL — ABNORMAL HIGH (ref 100–199)
HDL: 48 mg/dL (ref >39)
LDL Chol Calc (NIH): 124 mg/dL — ABNORMAL HIGH (ref 0–99)
Triglycerides: 315 mg/dL — ABNORMAL HIGH (ref 0–149)
VLDL Cholesterol Cal: 55 mg/dL — ABNORMAL HIGH (ref 5–40)

## 2022-06-09 LAB — HEMOGLOBIN A1C
Est. average glucose Bld gHb Est-mCnc: 105 mg/dL
Hgb A1c MFr Bld: 5.3 % (ref 4.8–5.6)

## 2022-06-09 NOTE — Progress Notes (Signed)
Hi Ralph Lawson. It was nice to see you yesterday.  Your lab work looks good.  Cholesterol is elevated but cardiac risk score remains in the low risk range.  No concerns at this time. Continue with your current medication regimen.  Follow up as discussed.  Please let me know if you have any questions.   The 10-year ASCVD risk score (Arnett DK, et al., 2019) is: 2.9%   Values used to calculate the score:     Age: 47 years     Sex: Male     Is Non-Hispanic African American: No     Diabetic: No     Tobacco smoker: No     Systolic Blood Pressure: 127 mmHg     Is BP treated: No     HDL Cholesterol: 48 mg/dL     Total Cholesterol: 227 mg/dL

## 2022-11-17 ENCOUNTER — Ambulatory Visit (INDEPENDENT_AMBULATORY_CARE_PROVIDER_SITE_OTHER): Payer: Self-pay | Admitting: Pediatrics

## 2022-11-17 ENCOUNTER — Encounter: Payer: Self-pay | Admitting: Nurse Practitioner

## 2022-11-17 VITALS — BP 128/84 | HR 89 | Temp 98.6°F | Ht 73.5 in | Wt 192.6 lb

## 2022-11-17 DIAGNOSIS — J189 Pneumonia, unspecified organism: Secondary | ICD-10-CM

## 2022-11-17 DIAGNOSIS — R0602 Shortness of breath: Secondary | ICD-10-CM

## 2022-11-17 DIAGNOSIS — H6690 Otitis media, unspecified, unspecified ear: Secondary | ICD-10-CM

## 2022-11-17 MED ORDER — ALBUTEROL SULFATE HFA 108 (90 BASE) MCG/ACT IN AERS: 2.0000 | INHALATION_SPRAY | Freq: Four times a day (QID) | RESPIRATORY_TRACT | 2 refills | Status: DC | PRN

## 2022-11-17 MED ORDER — AMOXICILLIN-POT CLAVULANATE 875-125 MG PO TABS: 1.0000 | ORAL_TABLET | Freq: Two times a day (BID) | ORAL | 0 refills | Status: AC

## 2022-11-17 NOTE — Progress Notes (Signed)
Acute Visit  BP 128/84 (BP Location: Left Arm, Patient Position: Sitting, Cuff Size: Normal)   Pulse 89   Temp 98.6 F (37 C) (Oral)   Ht 6' 1.5" (1.867 m)   Wt 192 lb 9.6 oz (87.4 kg)   SpO2 96%   BMI 25.07 kg/m    Subjective:    Patient ID: Ralph Lawson, male    DOB: Jan 22, 1975, 47 y.o.   MRN: [id number]  HPI: Ralph Lawson is a 47 y.o. male  Chief Complaint  Patient presents with   Nasal Congestion   Ear Pain    Bilateral ears, left over right, has been going on for several weeks, blew nose and it threw equilibrium off, left ear possible irritation prior treatment has been 600 mg of ibuprofen, alkuseltzer, ear drops, would like to also discuss burning when breathing in the chest, has been a problem for about 1 week. Everything seem to happen at the same time    Discussed the use of AI scribe software for clinical note transcription with the patient, who gave verbal consent to proceed.  History of Present Illness   The patient, with a history of smoking, presented with symptoms of an upper respiratory infection that started approximately two weeks ago. Initially, the patient experienced a sore throat and nasal congestion, which later progressed to chest congestion. Over the past week, the patient noticed an onset of ear discomfort, initially in the left ear, which later spread to the right ear. The left ear discomfort is described as severe, with the patient only able to perceive a ringing sound. The right ear discomfort is less severe.  The patient also reported episodes of vertigo, particularly when blowing their nose, which has affected their confidence when working on ladders. The patient described a sensation of pressure in the head, which they found distracting. They also reported sinus pressure and jaw pain, with the sensation of congestion in the sinus region.  Over-the-counter medications, including Alka Seltzer and ibuprofen, have been used for symptom management. The patient  reported some relief from ibuprofen. An ear drop for swimmer's ear was also tried, which provided a slight numbing effect.  The patient also reported chest discomfort, described as a burning sensation, which they are not accustomed to. The discomfort is sporadic and not associated with coughing. The patient denied having any inhalers.  The patient also reported possible low-grade fevers. They have been using Afrin spray for symptom relief and are aware of the tolerance that can develop with prolonged use.     Relevant past medical, surgical, family and social history reviewed and updated as indicated. Interim medical history since our last visit reviewed. Allergies and medications reviewed and updated.  ROS per HPI unless specifically indicated above     Objective:    BP 128/84 (BP Location: Left Arm, Patient Position: Sitting, Cuff Size: Normal)   Pulse 89   Temp 98.6 F (37 C) (Oral)   Ht 6' 1.5" (1.867 m)   Wt 192 lb 9.6 oz (87.4 kg)   SpO2 96%   BMI 25.07 kg/m   Wt Readings from Last 3 Encounters:  11/17/22 192 lb 9.6 oz (87.4 kg)  06/08/22 192 lb 6.4 oz (87.3 kg)  12/08/21 188 lb 4.8 oz (85.4 kg)    Physical Exam Constitutional:      Appearance: Normal appearance.  HENT:     Head: Normocephalic and atraumatic.     Right Ear: Tympanic membrane is erythematous and bulging.  Left Ear: Tympanic membrane is erythematous and bulging.  Eyes:     Pupils: Pupils are equal, round, and reactive to light.  Cardiovascular:     Rate and Rhythm: Normal rate and regular rhythm.     Pulses: Normal pulses.     Heart sounds: Normal heart sounds.  Pulmonary:     Effort: Pulmonary effort is normal.     Breath sounds: Examination of the left-lower field reveals rales. Wheezing and rales present.     Comments: Mild inspiratory wheezing cleared with deep breathing in upper lung fields Abdominal:     General: Abdomen is flat.     Palpations: Abdomen is soft.  Musculoskeletal:         General: Normal range of motion.     Cervical back: Normal range of motion.  Skin:    General: Skin is warm and dry.     Capillary Refill: Capillary refill takes less than 2 seconds.  Neurological:     General: No focal deficit present.     Mental Status: He is alert. Mental status is at baseline.  Psychiatric:        Mood and Affect: Mood normal.        Behavior: Behavior normal.        Assessment & Plan:  Assessment & Plan   Acute otitis media, unspecified otitis media type Severe left ear pain with decreased hearing and ringing, right ear starting to be affected. No improvement with over-the-counter swimmer's ear drops. -Prescribe Augmentin for 7 days. More broad coverage due to suspected CAP as below. -     Amoxicillin-Pot Clavulanate; Take 1 tablet by mouth 2 (two) times daily for 7 days.  Dispense: 14 tablet; Refill: 0  Community acquired pneumonia, unspecified laterality Symptoms started two weeks ago with sore throat and upper respiratory congestion, progressed to chest discomfort described as burning and increased congestion. Wheezing noted on exam, patient reports feeling more congested and coughing up more than usual. -Prescribe Augmentin for 7 days. -Albuterol inhaler for wheezing, patient to decide if needed. -Advise over-the-counter Mucinex DM or Robitussin DM to help break up mucus. -     Amoxicillin-Pot Clavulanate; Take 1 tablet by mouth 2 (two) times daily for 7 days.  Dispense: 14 tablet; Refill: 0 -     Albuterol Sulfate HFA; Inhale 2 puffs into the lungs every 6 (six) hours as needed for wheezing or shortness of breath.  Dispense: 8 g; Refill: 2  Follow up plan: Return if symptoms worsen or fail to improve.  Sallyann Kinnaird Howell Pringle, MD

## 2022-11-17 NOTE — Patient Instructions (Addendum)
Congestion: - oxymetazoline (Afrin) nasal stray: 2 sprays each nostril every 12 hours. Don't use more than 7 days in a row to avoid building a tolerance to it.  - Simple saline (nasal saline spray) 3-4 times per day. - Can try flonase 2 sprays per nostril per day for 2 weeks. Place in nostril, point toward back of head, spray, and inhale after spray. Repeat with opposite nostril - Very normal for it to be worse overnight and in the morning. Can try humidifier, steam shower, and elevating head with extra pillow. - Mucinex (can get 12 hr extended release version). Good for reducing runny nose and throat drainage. Mucinex-DM also contains a cough suppressant - For headaches, achiness, or ear / sinus pressure, you can use Tylenol (acetaminophen) and/or Advil (ibuprofen) - 2 pills up to every 6-8 hours. - Night-time options (can make people sleeping)  - Benadryl - For nighttime use for itching and thinning out mucus.  - Nyquil - Contains benadryl, tylenol, and other medicines to dry up mucus. Do not use at the same time as other cough or congestion medicines.

## 2022-11-20 ENCOUNTER — Encounter: Payer: Self-pay | Admitting: Pediatrics

## 2022-12-13 ENCOUNTER — Encounter: Payer: Self-pay | Admitting: Nurse Practitioner

## 2023-01-03 IMAGING — CT CT ABD-PELV W/ CM
2 of 5 series · 15 of 46 positions shown, 17 images · IV contrast (APPLIED)
Comparison: None.

CLINICAL DATA: Right lower quadrant abdominal pain with nausea,
vomiting and diarrhea

EXAM:
CT ABDOMEN AND PELVIS WITH CONTRAST
TECHNIQUE: Multidetector CT imaging of the abdomen and pelvis was performed
using the standard protocol following bolus administration of
intravenous contrast.

[Series 2: routine abd/pel with · axial · 0.73mm/px · z∈[-974,-524]mm · 12 of 102 slices shown, 14 images]
[im 6/102  soft-tissue]
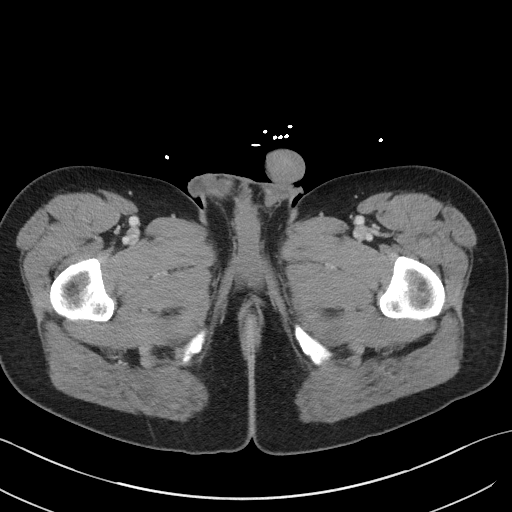
[im 6/102  bone]
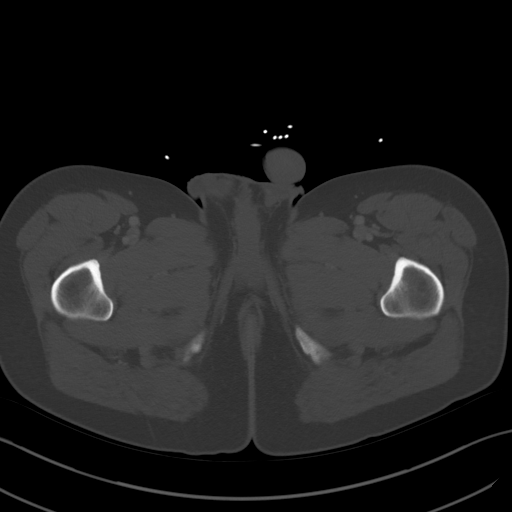
[im 17/102  soft-tissue]
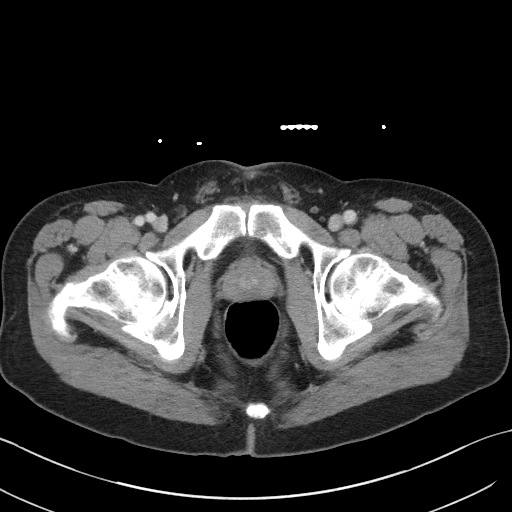
[im 23/102  soft-tissue]
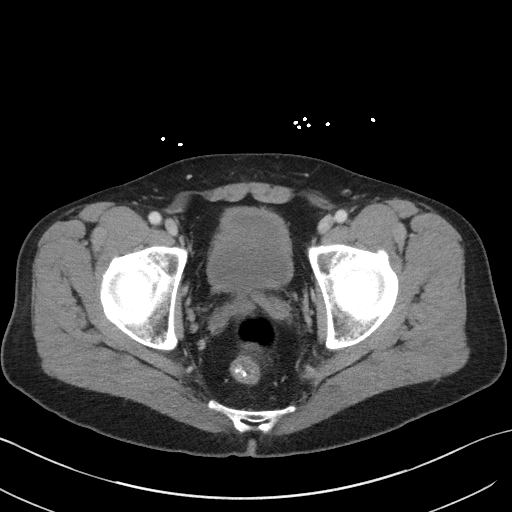
[im 29/102  soft-tissue]
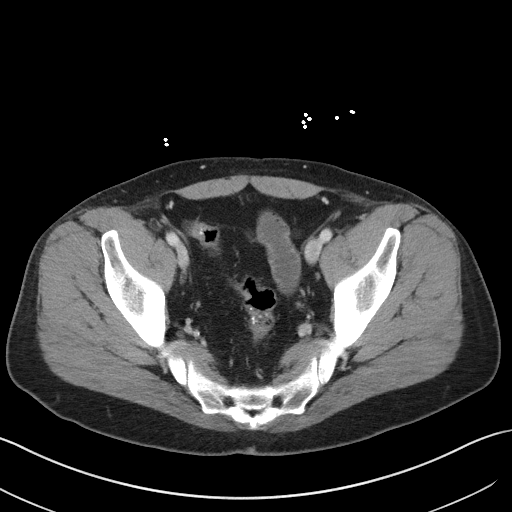
[im 40/102  soft-tissue]
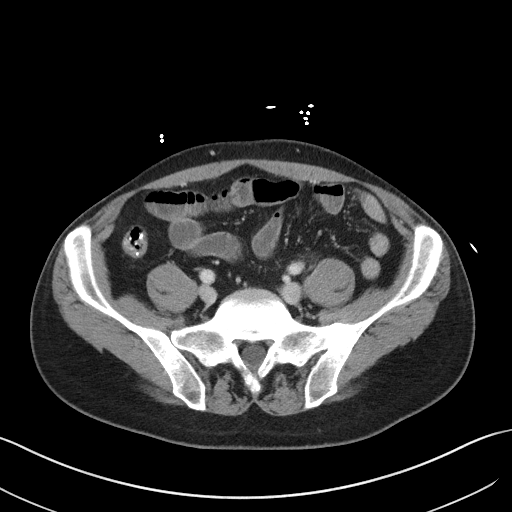
[im 45/102  soft-tissue]
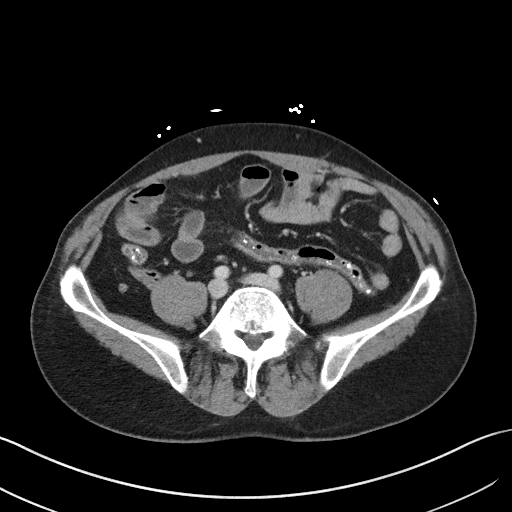
[im 57/102  soft-tissue]
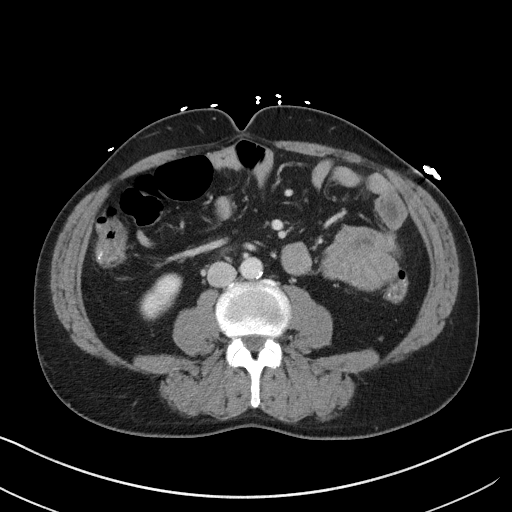
[im 62/102  soft-tissue]
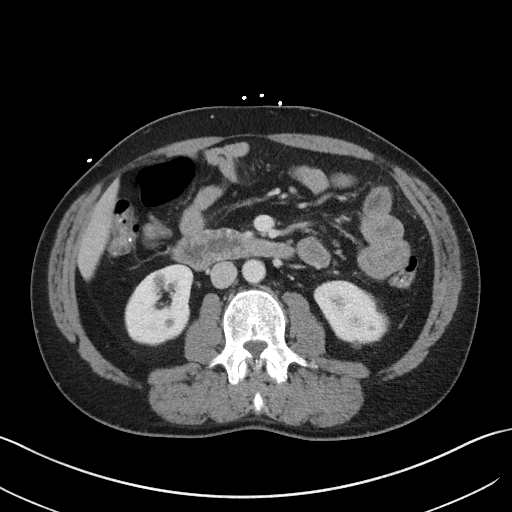
[im 73/102  soft-tissue]
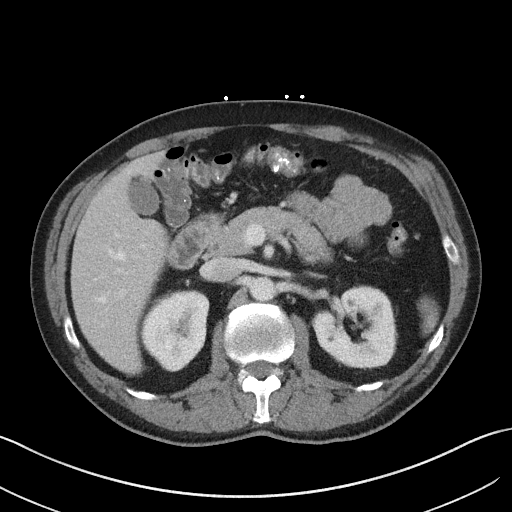
[im 73/102  bone]
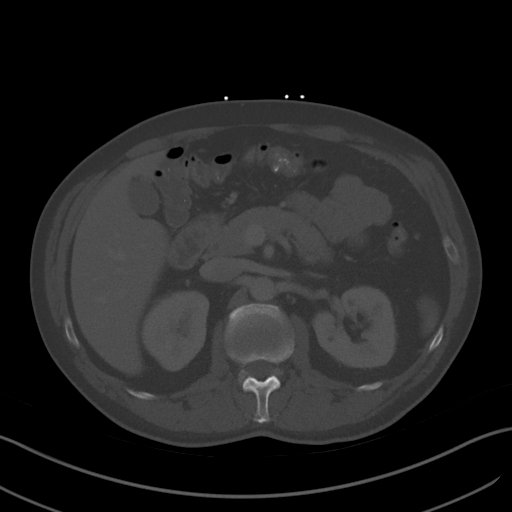
[im 79/102  soft-tissue]
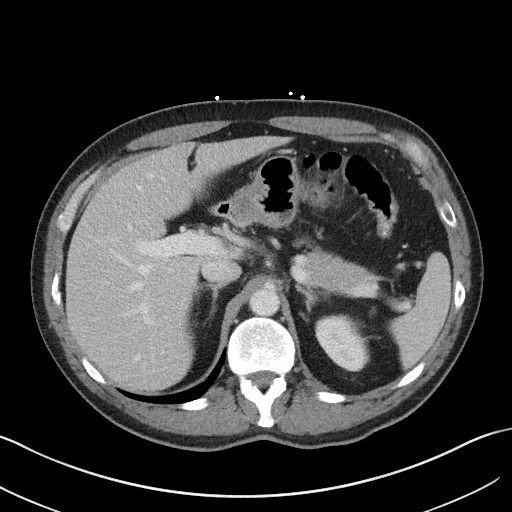
[im 85/102  soft-tissue]
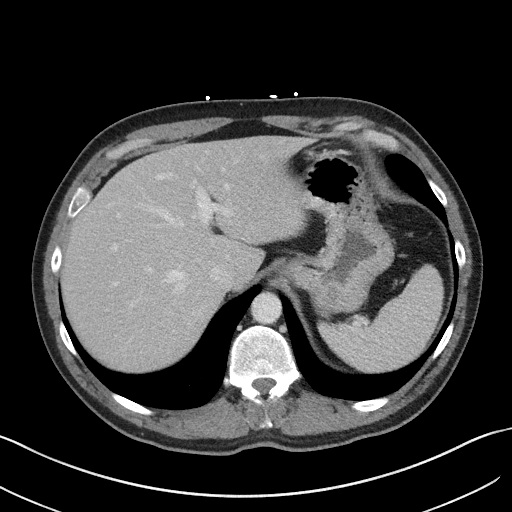
[im 96/102  soft-tissue]
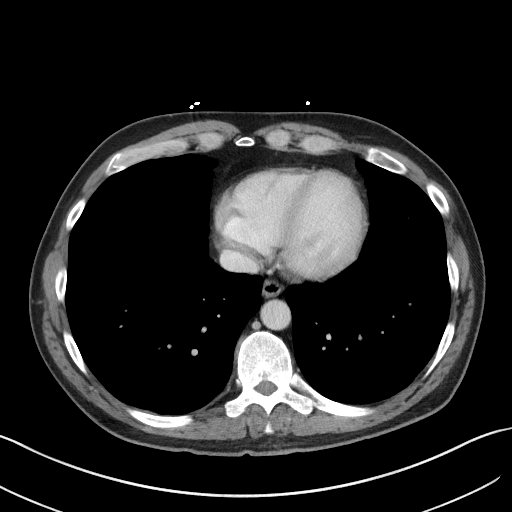

[Series 5: coronal st · coronal · 0.76mm/px · 3 of 83 slices shown]
[im 28/83  soft-tissue]
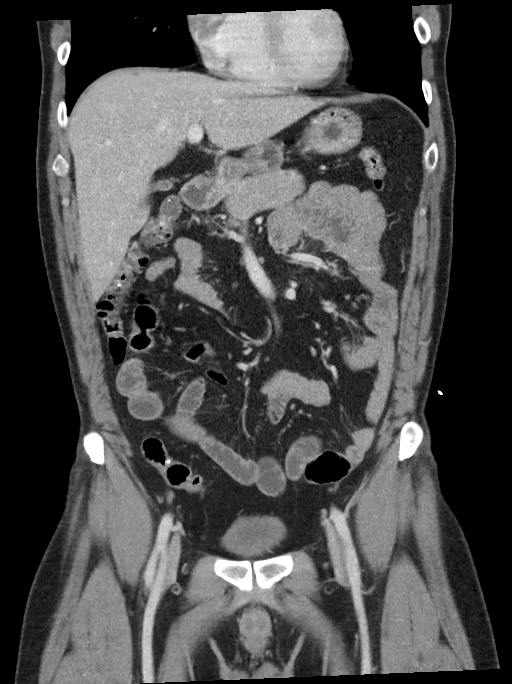
[im 37/83  soft-tissue]
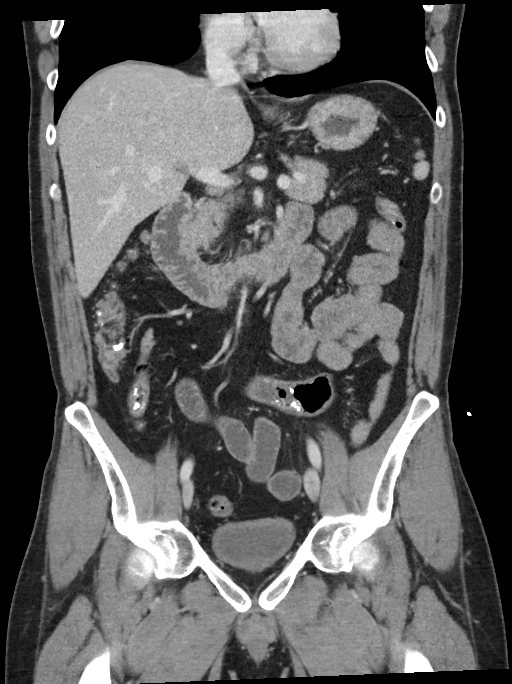
[im 46/83  soft-tissue]
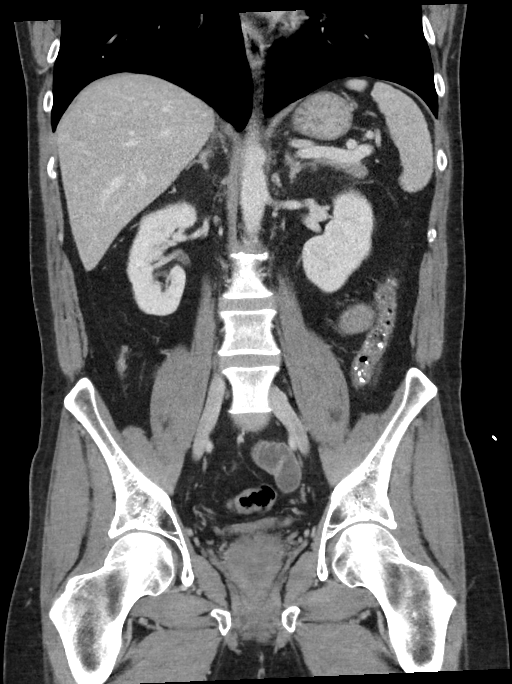

[15 of 46 positions shown; findings below may reference images not displayed]

RADIATION DOSE REDUCTION: This exam was performed according to the
departmental dose-optimization program which includes automated
exposure control, adjustment of the mA and/or kV according to
patient size and/or use of iterative reconstruction technique.

CONTRAST:  100mL OMNIPAQUE IOHEXOL 300 MG/ML  SOLN
FINDINGS: Lower chest: No acute abnormality.

Hepatobiliary: No focal liver abnormality is seen. No gallstones,
gallbladder wall thickening, or biliary dilatation.

Pancreas: Unremarkable. No pancreatic ductal dilatation or
surrounding inflammatory changes.

Spleen: Normal in size without focal abnormality.

Adrenals/Urinary Tract: Normal adrenal glands.

Both kidneys demonstrate scattered small cortical hypodense cysts,
no further imaging follow-up recommended. No renal obstruction or
hydronephrosis. No hydroureter or ureteral calculus. Bladder
unremarkable.

Stomach/Bowel: Negative for bowel obstruction, significant
dilatation, ileus, or free air. Normal appendix in the right lower
quadrant. Minor lower abdomen fluid distended small bowel with a few
scattered air-fluid levels can be seen with ongoing diarrhea. No
definite bowel wall thickening.

No free fluid, fluid collection, hemorrhage, hematoma, abscess, or
ascites.

Vascular/Lymphatic: Distal aortic minor atherosclerosis. No aneurysm
or dissection. No occlusive process. Mesenteric and renal
vasculature appear patent. No Aaren finding. No bulky
adenopathy.

Reproductive: No significant finding by CT.

Other: No abdominal wall hernia or abnormality. No abdominopelvic
ascites.

Musculoskeletal: No acute or significant osseous findings.
IMPRESSION: No acute intra-abdominal or pelvic finding by CT.

Normal appendix

Aortic Atherosclerosis (KE6JQ-WTL.L).

## 2023-02-13 ENCOUNTER — Other Ambulatory Visit: Payer: Self-pay | Admitting: Nurse Practitioner

## 2023-02-15 NOTE — Telephone Encounter (Signed)
 Requested Prescriptions  Pending Prescriptions Disp Refills   sildenafil  (VIAGRA ) 50 MG tablet [Pharmacy Med Name: Sildenafil  Citrate 50 MG Oral Tablet] 10 tablet 0    Sig: TAKE 1 TABLET BY MOUTH ONCE DAILY AS NEEDED FOR ERECTILE DYSFUNCTION     Urology: Erectile Dysfunction Agents Passed - 02/15/2023  8:29 AM      Passed - AST in normal range and within 360 days    AST  Date Value Ref Range Status  06/08/2022 18 0 - 40 IU/L Final         Passed - ALT in normal range and within 360 days    ALT  Date Value Ref Range Status  06/08/2022 21 0 - 44 IU/L Final         Passed - Last BP in normal range    BP Readings from Last 1 Encounters:  11/17/22 128/84         Passed - Valid encounter within last 12 months    Recent Outpatient Visits           3 months ago Acute otitis media, unspecified otitis media type   [place] [facility] Hadassah SQUIBB, MD   8 months ago Elevated glucose   [place] [facility] Melvin Pao, NP   1 year ago Obsessive-compulsive disorder, unspecified type   [place] [facility] Melvin Pao, NP   1 year ago Obsessive-compulsive disorder, unspecified type   [place] [facility] Melvin Pao, NP   1 year ago Annual physical exam   [place] [facility] Melvin Pao, NP

## 2023-04-05 ENCOUNTER — Other Ambulatory Visit: Payer: Self-pay | Admitting: Nurse Practitioner

## 2023-04-06 NOTE — Telephone Encounter (Signed)
 Requested medications are due for refill today.  yes  Requested medications are on the active medications list.  yes  Last refill. 12/08/2021 #90 1 rf  Future visit scheduled.   no  Notes to clinic.  Medication was last ordered 12/08/2021. Please review for refill.    Requested Prescriptions  Pending Prescriptions Disp Refills   citalopram (CELEXA) 40 MG tablet [Pharmacy Med Name: Citalopram Hydrobromide 40 MG Oral Tablet] 90 tablet 0    Sig: Take 1 tablet by mouth once daily     Psychiatry:  Antidepressants - SSRI Failed - 04/06/2023  2:55 PM      Failed - Valid encounter within last 6 months    Recent Outpatient Visits   None

## 2023-04-14 ENCOUNTER — Other Ambulatory Visit: Payer: Self-pay | Admitting: Nurse Practitioner

## 2023-04-14 NOTE — Telephone Encounter (Signed)
 Copied from CRM [id number]. Topic: Clinical - Medication Refill >> Apr 14, 2023  4:17 PM Tiffany S wrote: Most Recent Primary Care Visit:  Provider: Jackolyn Confer  Department: [id number]  Visit Type: OFFICE VISIT  Date: 11/17/2022  Medication: citalopram (CELEXA) 40 MG tablet [[id number]]  Has the patient contacted their pharmacy? Yes (Agent: If no, request that the patient contact the pharmacy for the refill. If patient does not wish to contact the pharmacy document the reason why and proceed with request.) (Agent: If yes, when and what did the pharmacy advise?)  Is this the correct pharmacy for this prescription? Yes If no, delete pharmacy and type the correct one.  This is the patient's preferred pharmacy:  [facility] [address] [place] - [address] [place] [address] Phone: [phone] Fax: [phone]   Has the prescription been filled recently? Yes  Is the patient out of the medication? Yes  Has the patient been seen for an appointment in the last year OR does the patient have an upcoming appointment? Yes  Can we respond through MyChart? Yes  Agent: Please be advised that Rx refills may take up to 3 business days. We ask that you follow-up with your pharmacy.

## 2023-04-17 NOTE — Telephone Encounter (Signed)
 Requested medications are due for refill today.  yes  Requested medications are on the active medications list.  yes  Last refill. 12/08/2021 #90 1 rf  Future visit scheduled.   no  Notes to clinic.  Pt is more than 3 months overdue for an ov.    Requested Prescriptions  Pending Prescriptions Disp Refills   citalopram (CELEXA) 40 MG tablet 90 tablet 1    Sig: Take 1 tablet (40 mg total) by mouth daily.     Psychiatry:  Antidepressants - SSRI Failed - 04/17/2023 10:30 AM      Failed - Valid encounter within last 6 months    Recent Outpatient Visits   None

## 2023-04-17 NOTE — Telephone Encounter (Signed)
 Patient is overdue for an appointment. Please call and schedule an appointment and then route to provider for refill.

## 2023-04-19 ENCOUNTER — Encounter: Payer: Self-pay | Admitting: Nurse Practitioner

## 2023-04-19 ENCOUNTER — Ambulatory Visit (INDEPENDENT_AMBULATORY_CARE_PROVIDER_SITE_OTHER): Admitting: Nurse Practitioner

## 2023-04-19 VITALS — BP 130/89 | HR 98 | Temp 97.9°F | Resp 15 | Ht 73.5 in | Wt 196.0 lb

## 2023-04-19 DIAGNOSIS — Z0000 Encounter for general adult medical examination without abnormal findings: Secondary | ICD-10-CM | POA: Diagnosis not present

## 2023-04-19 DIAGNOSIS — I700 Atherosclerosis of aorta: Secondary | ICD-10-CM

## 2023-04-19 DIAGNOSIS — R7309 Other abnormal glucose: Secondary | ICD-10-CM

## 2023-04-19 DIAGNOSIS — E782 Mixed hyperlipidemia: Secondary | ICD-10-CM | POA: Diagnosis not present

## 2023-04-19 DIAGNOSIS — F429 Obsessive-compulsive disorder, unspecified: Secondary | ICD-10-CM

## 2023-04-19 MED ORDER — CITALOPRAM HYDROBROMIDE 40 MG PO TABS: 40.0000 mg | ORAL_TABLET | Freq: Every day | ORAL | 1 refills | Status: DC

## 2023-04-19 MED ORDER — SILDENAFIL CITRATE 50 MG PO TABS: 50.0000 mg | ORAL_TABLET | Freq: Every day | ORAL | 0 refills | Status: DC | PRN

## 2023-04-19 MED ORDER — SILDENAFIL CITRATE 50 MG PO TABS: 50.0000 mg | ORAL_TABLET | Freq: Every day | ORAL | 3 refills | Status: AC | PRN

## 2023-04-19 NOTE — Progress Notes (Signed)
 BP 130/89 (BP Location: Left Arm, Patient Position: Sitting, Cuff Size: Large)   Pulse 98   Temp 97.9 F (36.6 C) (Oral)   Resp 15   Ht 6' 1.5" (1.867 m)   Wt 196 lb (88.9 kg)   SpO2 98%   BMI 25.51 kg/m    Subjective:    Patient ID: Ralph Lawson, male    DOB: [birth year], 48 y.o.   MRN: [id number]  HPI: Ralph Lawson is a 48 y.o. male presenting on 04/19/2023 for comprehensive medical examination. Current medical complaints include:none  He currently lives with: Interim Problems from his last visit: no  OCD Patient states he has bene off his meds for about a week.  States it has been troubling with his OCD.  His wife has noticed and wants him to get back on his Celexa.  He feels a little more scatter brained.  Symptoms were previously well controlled with the Citalopram.  Denies SI.     Depression Screen done today and results listed below:     04/19/2023    2:02 PM 11/17/2022   11:36 AM 06/08/2022    8:29 AM 12/08/2021    8:14 AM 09/07/2021    8:24 AM  Depression screen PHQ 2/9  Decreased Interest 0 0 1 0 1  Down, Depressed, Hopeless 0 1 1 1 1   PHQ - 2 Score 0 1 2 1 2   Altered sleeping 0 1 0 1 1  Tired, decreased energy 0 1 1 1 1   Change in appetite 0 2 1 1 1   Feeling bad or failure about yourself  0 0 1 0 1  Trouble concentrating 0 0 0 0 2  Moving slowly or fidgety/restless 0 0 0 0 2  Suicidal thoughts 0 0 0 0 0  PHQ-9 Score 0 5 5 4 10   Difficult doing work/chores Not difficult at all  Somewhat difficult Not difficult at all Very difficult    The patient does not have a history of falls. I did complete a risk assessment for falls. A plan of care for falls was documented.   Past Medical History:  Past Medical History:  Diagnosis Date   OCD (obsessive compulsive disorder)     Surgical History:  Past Surgical History:  Procedure Laterality Date   collapsed lung     COLONOSCOPY WITH PROPOFOL N/A 03/15/2021   Procedure: COLONOSCOPY WITH PROPOFOL;  Surgeon: Wyline Mood,  MD;  Location: [facility] ENDOSCOPY;  Service: Gastroenterology;  Laterality: N/A;   EYE SURGERY     R K Surgery    Medications:  Current Outpatient Medications on File Prior to Visit  Medication Sig   albuterol (VENTOLIN HFA) 108 (90 Base) MCG/ACT inhaler Inhale 2 puffs into the lungs every 6 (six) hours as needed for wheezing or shortness of breath.   No current facility-administered medications on file prior to visit.    Allergies:  No Known Allergies  Social History:  Social History   Socioeconomic History   Marital status: Married    Spouse name: [name]   Number of children: 4   Years of education: Not on file   Highest education level: GED or equivalent  Occupational History   Not on file  Tobacco Use   Smoking status: Former    Current packs/day: 0.00    Average packs/day: 1 pack/day for 31.4 years (31.4 ttl pk-yrs)    Types: Cigarettes    Start date: 03/30/1990    Quit date: 08/19/2021    Years since  quitting: 1.6   Smokeless tobacco: Never  Vaping Use   Vaping status: Every Day  Substance and Sexual Activity   Alcohol use: Yes    Alcohol/week: 3.0 standard drinks of alcohol    Types: 3 Cans of beer per week   Drug use: Yes    Types: Marijuana    Comment: 03/13/21   Sexual activity: Yes    Birth control/protection: None  Other Topics Concern   Not on file  Social History Narrative   Not on file   Social Drivers of Health   Financial Resource Strain: Patient Declined (04/19/2023)   Overall Financial Resource Strain (CARDIA)    Difficulty of Paying Living Expenses: Patient declined  Food Insecurity: Patient Declined (04/19/2023)   Hunger Vital Sign    Worried About Running Out of Food in the Last Year: Patient declined    Ran Out of Food in the Last Year: Patient declined  Transportation Needs: Patient Declined (04/19/2023)   PRAPARE - Administrator, Civil Service (Medical): Patient declined    Lack of Transportation (Non-Medical): Patient declined   Physical Activity: Unknown (04/19/2023)   Exercise Vital Sign    Days of Exercise per Week: Patient declined    Minutes of Exercise per Session: 120 min  Stress: Patient Declined (04/19/2023)   [employer] of Occupational Health - Occupational Stress Questionnaire    Feeling of Stress : Patient declined  Social Connections: Unknown (04/19/2023)   Social Connection and Isolation Panel [NHANES]    Frequency of Communication with Friends and Family: Patient declined    Frequency of Social Gatherings with Friends and Family: Patient declined    Attends Religious Services: Patient declined    Database administrator or Organizations: Patient declined    Attends Engineer, structural: Not on file    Marital Status: Married  Catering manager Violence: Not on file   Social History   Tobacco Use  Smoking Status Former   Current packs/day: 0.00   Average packs/day: 1 pack/day for 31.4 years (31.4 ttl pk-yrs)   Types: Cigarettes   Start date: 03/30/1990   Quit date: 08/19/2021   Years since quitting: 1.6  Smokeless Tobacco Never   Social History   Substance and Sexual Activity  Alcohol Use Yes   Alcohol/week: 3.0 standard drinks of alcohol   Types: 3 Cans of beer per week    Family History:  Family History  Problem Relation Age of Onset   Dementia Mother    Cancer Mother        Breast   Cancer Father        Skin   Bladder Cancer Father    Autism Son    Dementia Maternal Grandmother    Cancer Maternal Grandfather        Colon   Alzheimer's disease Maternal Grandfather    Alcohol abuse Maternal Grandfather    Hypertension Paternal Grandmother    Hypertension Paternal Grandfather    Diabetes Paternal Grandfather     Past medical history, surgical history, medications, allergies, family history and social history reviewed with patient today and changes made to appropriate areas of the chart.   Review of Systems  Eyes:  Negative for blurred vision and double vision.   Respiratory:  Negative for shortness of breath.   Cardiovascular:  Negative for chest pain, palpitations and leg swelling.  Neurological:  Negative for dizziness and headaches.  Psychiatric/Behavioral:  The patient is nervous/anxious.  OCD   All other ROS negative except what is listed above and in the HPI.      Objective:    BP 130/89 (BP Location: Left Arm, Patient Position: Sitting, Cuff Size: Large)   Pulse 98   Temp 97.9 F (36.6 C) (Oral)   Resp 15   Ht 6' 1.5" (1.867 m)   Wt 196 lb (88.9 kg)   SpO2 98%   BMI 25.51 kg/m   Wt Readings from Last 3 Encounters:  04/19/23 196 lb (88.9 kg)  11/17/22 192 lb 9.6 oz (87.4 kg)  06/08/22 192 lb 6.4 oz (87.3 kg)    Physical Exam Vitals and nursing note reviewed.  Constitutional:      General: He is not in acute distress.    Appearance: Normal appearance. He is normal weight. He is not ill-appearing, toxic-appearing or diaphoretic.  HENT:     Head: Normocephalic.     Right Ear: Tympanic membrane, ear canal and external ear normal.     Left Ear: Tympanic membrane, ear canal and external ear normal.     Nose: Nose normal. No congestion or rhinorrhea.     Mouth/Throat:     Mouth: Mucous membranes are moist.  Eyes:     General:        Right eye: No discharge.        Left eye: No discharge.     Extraocular Movements: Extraocular movements intact.     Conjunctiva/sclera: Conjunctivae normal.     Pupils: Pupils are equal, round, and reactive to light.  Cardiovascular:     Rate and Rhythm: Normal rate and regular rhythm.     Heart sounds: No murmur heard. Pulmonary:     Effort: Pulmonary effort is normal. No respiratory distress.     Breath sounds: Normal breath sounds. No wheezing, rhonchi or rales.  Abdominal:     General: Abdomen is flat. Bowel sounds are normal. There is no distension.     Palpations: Abdomen is soft.     Tenderness: There is no abdominal tenderness. There is no guarding.  Musculoskeletal:      Cervical back: Normal range of motion and neck supple.  Skin:    General: Skin is warm and dry.     Capillary Refill: Capillary refill takes less than 2 seconds.  Neurological:     General: No focal deficit present.     Mental Status: He is alert and oriented to person, place, and time.     Cranial Nerves: No cranial nerve deficit.     Motor: No weakness.     Deep Tendon Reflexes: Reflexes normal.  Psychiatric:        Mood and Affect: Mood normal.        Behavior: Behavior normal.        Thought Content: Thought content normal.        Judgment: Judgment normal.     Results for orders placed or performed in visit on 06/08/22  Comp Met (CMET)   Collection Time: 06/08/22  8:44 AM  Result Value Ref Range   Glucose 79 70 - 99 mg/dL   BUN 12 6 - 24 mg/dL   Creatinine, Ser 8.65 0.76 - 1.27 mg/dL   eGFR 784 >69 GE/XBM/8.41   BUN/Creatinine Ratio 15 9 - 20   Sodium 140 134 - 144 mmol/L   Potassium 4.6 3.5 - 5.2 mmol/L   Chloride 101 96 - 106 mmol/L   CO2 23 20 - 29 mmol/L   Calcium  9.8 8.7 - 10.2 mg/dL   Total Protein 7.2 6.0 - 8.5 g/dL   Albumin 4.9 4.1 - 5.1 g/dL   Globulin, Total 2.3 1.5 - 4.5 g/dL   Albumin/Globulin Ratio 2.1 1.2 - 2.2   Bilirubin Total 0.2 0.0 - 1.2 mg/dL   Alkaline Phosphatase 67 44 - 121 IU/L   AST 18 0 - 40 IU/L   ALT 21 0 - 44 IU/L  Lipid Profile   Collection Time: 06/08/22  8:44 AM  Result Value Ref Range   Cholesterol, Total 227 (H) 100 - 199 mg/dL   Triglycerides 213 (H) 0 - 149 mg/dL   HDL 48 >08 mg/dL   VLDL Cholesterol Cal 55 (H) 5 - 40 mg/dL   LDL Chol Calc (NIH) 657 (H) 0 - 99 mg/dL   Chol/HDL Ratio 4.7 0.0 - 5.0 ratio  HgB A1c   Collection Time: 06/08/22  8:44 AM  Result Value Ref Range   Hgb A1c MFr Bld 5.3 4.8 - 5.6 %   Est. average glucose Bld gHb Est-mCnc 105 mg/dL      Assessment & Plan:   Problem List Items Addressed This Visit       Cardiovascular and Mediastinum   Atherosclerosis of aorta (HCC)   Chronic.  Controlled.   Continue with current medication regimen.  Labs ordered today.  Return to clinic in 6 months for reevaluation.  Call sooner if concerns arise.        Relevant Medications   sildenafil (VIAGRA) 50 MG tablet     Other   OCD (obsessive compulsive disorder)   Chronic. Not well controlled due to being out of his medication.  He feels like getting restarted on his Celexa will help his symptoms.  Will restart medication.  Follow up in 6 months.  Call sooner if concerns arise.       Elevated glucose   Labs ordered at visit today.  Will make recommendations based on lab results.        Relevant Orders   Hemoglobin A1c   Mixed hyperlipidemia   Chronic.  Controlled.  Continue with current medication regimen.  Labs ordered today.  Return to clinic in 6 months for reevaluation.  Call sooner if concerns arise.        Relevant Medications   sildenafil (VIAGRA) 50 MG tablet   Other Relevant Orders   Lipid panel   Other Visit Diagnoses       Annual physical exam    -  Primary   Health maintenance reviewed during visit today.  Labs ordered.  Vaccines reviewed.   Relevant Orders   TSH   Lipid panel   CBC with Differential/Platelet   Comprehensive metabolic panel with GFR        Discussed aspirin prophylaxis for myocardial infarction prevention and decision was it was not indicated  LABORATORY TESTING:  Health maintenance labs ordered today as discussed above.    IMMUNIZATIONS:   - Tdap: Tetanus vaccination status reviewed: last tetanus booster within 10 years. - Influenza: Postponed to flu season - Pneumovax: Not applicable - Prevnar: Not applicable - COVID: Not applicable - HPV: Not applicable - Shingrix vaccine: Not applicable  SCREENING: - Colonoscopy: Up to date  Discussed with patient purpose of the colonoscopy is to detect colon cancer at curable precancerous or early stages   - AAA Screening: Not applicable  -Hearing Test: Not applicable  -Spirometry: Not applicable    PATIENT COUNSELING:    Sexuality: Discussed sexually transmitted diseases, partner  selection, use of condoms, avoidance of unintended pregnancy  and contraceptive alternatives.   Advised to avoid cigarette smoking.  I discussed with the patient that most people either abstain from alcohol or drink within safe limits (<=14/week and <=4 drinks/occasion for males, <=7/weeks and <= 3 drinks/occasion for females) and that the risk for alcohol disorders and other health effects rises proportionally with the number of drinks per week and how often a drinker exceeds daily limits.  Discussed cessation/primary prevention of drug use and availability of treatment for abuse.   Diet: Encouraged to adjust caloric intake to maintain  or achieve ideal body weight, to reduce intake of dietary saturated fat and total fat, to limit sodium intake by avoiding high sodium foods and not adding table salt, and to maintain adequate dietary potassium and calcium preferably from fresh fruits, vegetables, and low-fat dairy products.    stressed the importance of regular exercise  Injury prevention: Discussed safety belts, safety helmets, smoke detector, smoking near bedding or upholstery.   Dental health: Discussed importance of regular tooth brushing, flossing, and dental visits.   Follow up plan: NEXT PREVENTATIVE PHYSICAL DUE IN 1 YEAR. No follow-ups on file.

## 2023-04-19 NOTE — Assessment & Plan Note (Signed)
 Chronic.  Controlled.  Continue with current medication regimen.  Labs ordered today.  Return to clinic in 6 months for reevaluation.  Call sooner if concerns arise.  ? ?

## 2023-04-19 NOTE — Assessment & Plan Note (Signed)
 Chronic. Not well controlled due to being out of his medication.  He feels like getting restarted on his Celexa will help his symptoms.  Will restart medication.  Follow up in 6 months.  Call sooner if concerns arise.

## 2023-04-19 NOTE — Telephone Encounter (Signed)
 Appointment scheduled for patient todat, 04-19-23 @ 2pm

## 2023-04-19 NOTE — Assessment & Plan Note (Signed)
 Labs ordered at visit today.  Will make recommendations based on lab results.

## 2023-04-20 ENCOUNTER — Encounter: Payer: Self-pay | Admitting: Nurse Practitioner

## 2023-04-20 LAB — CBC WITH DIFFERENTIAL/PLATELET
Basophils Absolute: 0 10*3/uL (ref 0.0–0.2)
Basos: 1 %
EOS (ABSOLUTE): 0.1 10*3/uL (ref 0.0–0.4)
Eos: 1 %
Hematocrit: 45.4 % (ref 37.5–51.0)
Hemoglobin: 15.4 g/dL (ref 13.0–17.7)
Immature Grans (Abs): 0 10*3/uL (ref 0.0–0.1)
Immature Granulocytes: 0 %
Lymphocytes Absolute: 2.5 10*3/uL (ref 0.7–3.1)
Lymphs: 31 %
MCH: 31.2 pg (ref 26.6–33.0)
MCHC: 33.9 g/dL (ref 31.5–35.7)
MCV: 92 fL (ref 79–97)
Monocytes Absolute: 0.6 10*3/uL (ref 0.1–0.9)
Monocytes: 8 %
Neutrophils Absolute: 4.9 10*3/uL (ref 1.4–7.0)
Neutrophils: 59 %
Platelets: 449 10*3/uL (ref 150–450)
RBC: 4.94 x10E6/uL (ref 4.14–5.80)
RDW: 13 % (ref 11.6–15.4)
WBC: 8.2 10*3/uL (ref 3.4–10.8)

## 2023-04-20 LAB — COMPREHENSIVE METABOLIC PANEL WITH GFR
ALT: 43 IU/L (ref 0–44)
AST: 27 IU/L (ref 0–40)
Albumin: 5 g/dL (ref 4.1–5.1)
Alkaline Phosphatase: 81 IU/L (ref 44–121)
BUN/Creatinine Ratio: 12 (ref 9–20)
BUN: 11 mg/dL (ref 6–24)
Bilirubin Total: 0.3 mg/dL (ref 0.0–1.2)
CO2: 23 mmol/L (ref 20–29)
Calcium: 10.3 mg/dL — ABNORMAL HIGH (ref 8.7–10.2)
Chloride: 99 mmol/L (ref 96–106)
Creatinine, Ser: 0.89 mg/dL (ref 0.76–1.27)
Globulin, Total: 2.7 g/dL (ref 1.5–4.5)
Glucose: 77 mg/dL (ref 70–99)
Potassium: 4.1 mmol/L (ref 3.5–5.2)
Sodium: 141 mmol/L (ref 134–144)
Total Protein: 7.7 g/dL (ref 6.0–8.5)
eGFR: 106 mL/min/{1.73_m2} (ref >59)

## 2023-04-20 LAB — HEMOGLOBIN A1C
Est. average glucose Bld gHb Est-mCnc: 108 mg/dL
Hgb A1c MFr Bld: 5.4 % (ref 4.8–5.6)

## 2023-04-20 LAB — LIPID PANEL
Chol/HDL Ratio: 3.8 ratio (ref 0.0–5.0)
Cholesterol, Total: 233 mg/dL — ABNORMAL HIGH (ref 100–199)
HDL: 61 mg/dL (ref >39)
LDL Chol Calc (NIH): 116 mg/dL — ABNORMAL HIGH (ref 0–99)
Triglycerides: 324 mg/dL — ABNORMAL HIGH (ref 0–149)
VLDL Cholesterol Cal: 56 mg/dL — ABNORMAL HIGH (ref 5–40)

## 2023-04-20 LAB — TSH: TSH: 2.51 u[IU]/mL (ref 0.450–4.500)

## 2023-10-04 ENCOUNTER — Ambulatory Visit: Payer: Self-pay

## 2023-10-04 NOTE — Telephone Encounter (Signed)
 FYI Only or Action Required?: FYI only for provider. Scheduled to be seen tomorrow at 10:40 AM  Patient was last seen in primary care on 04/19/2023 by Melvin Pao, NP.  Called Nurse Triage reporting Back Pain.  Symptoms began several days ago.  Interventions attempted: Rest, hydration, or home remedies.  Symptoms are: unchanged.  Triage Disposition: See PCP When Office is Open (Within 3 Days)  Patient/caregiver understands and will follow disposition?: Yes  Copied from CRM #[id number]. Topic: Clinical - Red Word Triage >> Oct 04, 2023  9:17 AM Mia F wrote: Red Word that prompted transfer to Nurse Triage: Pain in lower back not sure if it could be the kidney. Does not worsen or gets better with sitting or standing. Started about 5 days ago. Reason for Disposition  [1] MODERATE back pain (e.g., interferes with normal activities) AND [2] present > 3 days  Answer Assessment - Initial Assessment Questions 1. ONSET: When did the pain begin? (e.g., minutes, hours, days)     5 days ago 2. LOCATION: Where does it hurt? (upper, mid or lower back)     Bilateral lower back pain 3. SEVERITY: How bad is the pain?  (e.g., Scale 1-10; mild, moderate, or severe)     Moderate discomfort 4. PATTERN: Is the pain constant? (e.g., yes, no; constant, intermittent)      constant 5. RADIATION: Does the pain shoot into your legs or somewhere else?     no 6. CAUSE:  What do you think is causing the back pain?      unsure 7. BACK OVERUSE:  Any recent lifting of heavy objects, strenuous work or exercise?     Patient doesn't think so 8. MEDICINES: What have you taken so far for the pain? (e.g., nothing, acetaminophen, NSAIDS)     ibuprofen 9. NEUROLOGIC SYMPTOMS: Do you have any weakness, numbness, or problems with bowel/bladder control?     no 10. OTHER SYMPTOMS: Do you have any other symptoms? (e.g., fever, abdomen pain, burning with urination, blood in urine)       no  Protocols  used: Back Pain-A-AH

## 2023-10-05 ENCOUNTER — Ambulatory Visit: Admitting: Nurse Practitioner

## 2023-10-05 ENCOUNTER — Other Ambulatory Visit: Payer: Self-pay | Admitting: Nurse Practitioner

## 2023-10-05 ENCOUNTER — Encounter: Payer: Self-pay | Admitting: Nurse Practitioner

## 2023-10-05 VITALS — BP 127/73 | HR 84 | Temp 98.6°F | Ht 73.5 in | Wt 201.4 lb

## 2023-10-05 DIAGNOSIS — Z23 Encounter for immunization: Secondary | ICD-10-CM | POA: Diagnosis not present

## 2023-10-05 DIAGNOSIS — G8929 Other chronic pain: Secondary | ICD-10-CM

## 2023-10-05 DIAGNOSIS — M545 Low back pain, unspecified: Secondary | ICD-10-CM | POA: Diagnosis not present

## 2023-10-05 DIAGNOSIS — F429 Obsessive-compulsive disorder, unspecified: Secondary | ICD-10-CM | POA: Diagnosis not present

## 2023-10-05 LAB — MICROSCOPIC EXAMINATION
Bacteria, UA: NONE SEEN
Epithelial Cells (non renal): NONE SEEN /HPF (ref 0–10)
WBC, UA: NONE SEEN /HPF (ref 0–5)

## 2023-10-05 LAB — URINALYSIS, ROUTINE W REFLEX MICROSCOPIC
Bilirubin, UA: NEGATIVE
Glucose, UA: NEGATIVE
Ketones, UA: NEGATIVE
Leukocytes,UA: NEGATIVE
Nitrite, UA: NEGATIVE
Protein,UA: NEGATIVE
Specific Gravity, UA: 1.02 (ref 1.005–1.030)
Urobilinogen, Ur: 0.2 mg/dL (ref 0.2–1.0)
pH, UA: 7 (ref 5.0–7.5)

## 2023-10-05 MED ORDER — CITALOPRAM HYDROBROMIDE 20 MG PO TABS: 20.0000 mg | ORAL_TABLET | Freq: Every day | ORAL | 1 refills | Status: DC

## 2023-10-05 NOTE — Assessment & Plan Note (Signed)
 Chronic.  Exacerbated at this time.  Encouraged patient to decrease marijuana use.  Will increase Citalopram  to 60mg  daily.  Follow up in 1 month.  Call sooner if concerns arise.

## 2023-10-05 NOTE — Progress Notes (Signed)
 BP 127/73   Pulse 84   Temp 98.6 F (37 C) (Oral)   Ht 6' 1.5 (1.867 m)   Wt 201 lb 6.4 oz (91.4 kg)   SpO2 98%   BMI 26.21 kg/m    Subjective:    Patient ID: Ralph Lawson, male    DOB: 1975/05/05, 48 y.o.   MRN: [id number]  HPI: Ralph Lawson is a 48 y.o. male  Chief Complaint  Patient presents with   Back Pain    Patient states he has been experiencing an aching feeling in his lower back for the last week. States he does physical things for his job but does not have any injuries that he knows of. States he has fallen of a ladder twice this year but doesn't know if this is related.    Altered Mental Status    Patient states he has been having an increase in confusion and remembering this lately. States his wife has mentioned to him about these things becoming more frequent lately. States he is having trouble remembering this he has done for a long time. States he has also noticed an increase in his thoughts being more aggressive as well.    BACK PAIN Duration: low back pain bilateral Mechanism of injury: no trauma Location: bilateral Onset: sudden Severity: mild Quality: dull Frequency: constant Radiation: none Aggravating factors: none Alleviating factors: nothing Status: stable Treatments attempted: ibuprofen Relief with NSAIDs?: mild Nighttime pain:  no Paresthesias / decreased sensation:  no Bowel / bladder incontinence:  no Fevers:  no Dysuria / urinary frequency:  no  MOOD Patient states he feels like his mood is okay.  He is still taking the Citalopram  40mg .  He is having trouble staying on task.  He has let a couple of things get to him at work and has had to walk away.  Feels like he is getting more aggravated when he shouldn't be.      Relevant past medical, surgical, family and social history reviewed and updated as indicated. Interim medical history since our last visit reviewed. Allergies and medications reviewed and updated.  Review of Systems   Musculoskeletal:  Positive for back pain.  Psychiatric/Behavioral:  Positive for dysphoric mood. The patient is nervous/anxious.     Per HPI unless specifically indicated above     Objective:    BP 127/73   Pulse 84   Temp 98.6 F (37 C) (Oral)   Ht 6' 1.5 (1.867 m)   Wt 201 lb 6.4 oz (91.4 kg)   SpO2 98%   BMI 26.21 kg/m   Wt Readings from Last 3 Encounters:  10/05/23 201 lb 6.4 oz (91.4 kg)  04/19/23 196 lb (88.9 kg)  11/17/22 192 lb 9.6 oz (87.4 kg)    Physical Exam Vitals and nursing note reviewed.  Constitutional:      General: He is not in acute distress.    Appearance: Normal appearance. He is not ill-appearing, toxic-appearing or diaphoretic.  HENT:     Head: Normocephalic.     Right Ear: External ear normal.     Left Ear: External ear normal.     Nose: Nose normal. No congestion or rhinorrhea.     Mouth/Throat:     Mouth: Mucous membranes are moist.  Eyes:     General:        Right eye: No discharge.        Left eye: No discharge.     Extraocular Movements: Extraocular movements intact.  Conjunctiva/sclera: Conjunctivae normal.     Pupils: Pupils are equal, round, and reactive to light.  Cardiovascular:     Rate and Rhythm: Normal rate and regular rhythm.     Heart sounds: No murmur heard. Pulmonary:     Effort: Pulmonary effort is normal. No respiratory distress.     Breath sounds: Normal breath sounds. No wheezing, rhonchi or rales.  Abdominal:     General: Abdomen is flat. Bowel sounds are normal. There is no distension.     Palpations: Abdomen is soft. There is no mass.     Tenderness: There is no abdominal tenderness. There is right CVA tenderness. There is no left CVA tenderness, guarding or rebound.     Hernia: No hernia is present.  Musculoskeletal:     Cervical back: Normal range of motion and neck supple.  Skin:    General: Skin is warm and dry.     Capillary Refill: Capillary refill takes less than 2 seconds.  Neurological:      General: No focal deficit present.     Mental Status: He is alert and oriented to person, place, and time.  Psychiatric:        Mood and Affect: Mood normal.        Behavior: Behavior normal.        Thought Content: Thought content normal.        Judgment: Judgment normal.     Results for orders placed or performed in visit on 04/19/23  TSH   Collection Time: 04/19/23  2:27 PM  Result Value Ref Range   TSH 2.510 0.450 - 4.500 uIU/mL  Lipid panel   Collection Time: 04/19/23  2:27 PM  Result Value Ref Range   Cholesterol, Total 233 (H) 100 - 199 mg/dL   Triglycerides 675 (H) 0 - 149 mg/dL   HDL 61 >60 mg/dL   VLDL Cholesterol Cal 56 (H) 5 - 40 mg/dL   LDL Chol Calc (NIH) 883 (H) 0 - 99 mg/dL   Chol/HDL Ratio 3.8 0.0 - 5.0 ratio  CBC with Differential/Platelet   Collection Time: 04/19/23  2:27 PM  Result Value Ref Range   WBC 8.2 3.4 - 10.8 x10E3/uL   RBC 4.94 4.14 - 5.80 x10E6/uL   Hemoglobin 15.4 13.0 - 17.7 g/dL   Hematocrit 54.5 62.4 - 51.0 %   MCV 92 79 - 97 fL   MCH 31.2 26.6 - 33.0 pg   MCHC 33.9 31.5 - 35.7 g/dL   RDW 86.9 88.3 - 84.5 %   Platelets 449 150 - 450 x10E3/uL   Neutrophils 59 Not Estab. %   Lymphs 31 Not Estab. %   Monocytes 8 Not Estab. %   Eos 1 Not Estab. %   Basos 1 Not Estab. %   Neutrophils Absolute 4.9 1.4 - 7.0 x10E3/uL   Lymphocytes Absolute 2.5 0.7 - 3.1 x10E3/uL   Monocytes Absolute 0.6 0.1 - 0.9 x10E3/uL   EOS (ABSOLUTE) 0.1 0.0 - 0.4 x10E3/uL   Basophils Absolute 0.0 0.0 - 0.2 x10E3/uL   Immature Granulocytes 0 Not Estab. %   Immature Grans (Abs) 0.0 0.0 - 0.1 x10E3/uL  Comprehensive metabolic panel with GFR   Collection Time: 04/19/23  2:27 PM  Result Value Ref Range   Glucose 77 70 - 99 mg/dL   BUN 11 6 - 24 mg/dL   Creatinine, Ser 9.10 0.76 - 1.27 mg/dL   eGFR 893 >40 fO/fpw/8.26   BUN/Creatinine Ratio 12 9 - 20   Sodium  141 134 - 144 mmol/L   Potassium 4.1 3.5 - 5.2 mmol/L   Chloride 99 96 - 106 mmol/L   CO2 23 20 - 29  mmol/L   Calcium  10.3 (H) 8.7 - 10.2 mg/dL   Total Protein 7.7 6.0 - 8.5 g/dL   Albumin 5.0 4.1 - 5.1 g/dL   Globulin, Total 2.7 1.5 - 4.5 g/dL   Bilirubin Total 0.3 0.0 - 1.2 mg/dL   Alkaline Phosphatase 81 44 - 121 IU/L   AST 27 0 - 40 IU/L   ALT 43 0 - 44 IU/L  Hemoglobin A1c   Collection Time: 04/19/23  2:27 PM  Result Value Ref Range   Hgb A1c MFr Bld 5.4 4.8 - 5.6 %   Est. average glucose Bld gHb Est-mCnc 108 mg/dL      Assessment & Plan:   Problem List Items Addressed This Visit       Other   OCD (obsessive compulsive disorder) - Primary   Chronic.  Exacerbated at this time.  Encouraged patient to decrease marijuana use.  Will increase Citalopram  to 60mg  daily.  Follow up in 1 month.  Call sooner if concerns arise.       Other Visit Diagnoses       Chronic bilateral low back pain without sciatica       CVA tenderness on the right side. Will Bomkamp out kidney stone and UTI. UA and CMP checked at visit today.   Relevant Medications   citalopram  (CELEXA ) 20 MG tablet   Other Relevant Orders   Urinalysis, Routine w reflex microscopic   Comp Met (CMET)     Need for influenza vaccination       Relevant Orders   Flu vaccine trivalent PF, 6mos and older(Flulaval,Afluria,Fluarix,Fluzone) (Completed)        Follow up plan: Return in about 1 month (around 11/04/2023) for Depression/Anxiety FU.

## 2023-10-06 ENCOUNTER — Ambulatory Visit: Payer: Self-pay | Admitting: Nurse Practitioner

## 2023-10-06 DIAGNOSIS — R319 Hematuria, unspecified: Secondary | ICD-10-CM

## 2023-10-06 LAB — COMPREHENSIVE METABOLIC PANEL WITH GFR
ALT: 30 IU/L (ref 0–44)
AST: 18 IU/L (ref 0–40)
Albumin: 4.6 g/dL (ref 4.1–5.1)
Alkaline Phosphatase: 80 IU/L (ref 47–123)
BUN/Creatinine Ratio: 14 (ref 9–20)
BUN: 11 mg/dL (ref 6–24)
Bilirubin Total: 0.3 mg/dL (ref 0.0–1.2)
CO2: 24 mmol/L (ref 20–29)
Calcium: 9.8 mg/dL (ref 8.7–10.2)
Chloride: 101 mmol/L (ref 96–106)
Creatinine, Ser: 0.76 mg/dL (ref 0.76–1.27)
Globulin, Total: 2.5 g/dL (ref 1.5–4.5)
Glucose: 112 mg/dL — ABNORMAL HIGH (ref 70–99)
Potassium: 4.3 mmol/L (ref 3.5–5.2)
Sodium: 139 mmol/L (ref 134–144)
Total Protein: 7.1 g/dL (ref 6.0–8.5)
eGFR: 111 mL/min/1.73 (ref >59)

## 2023-10-06 NOTE — Telephone Encounter (Signed)
 Requested medications are due for refill today.  no  Requested medications are on the active medications list.  yes  Last refill. 10/05/2023 #90 1 rf  Future visit scheduled.   yes  Notes to clinic.  Pharmacy comment: Please clarify the directions  for this prescription. Is the total daily dose 60mg ? this states to take with a 60mg  dose.     Requested Prescriptions  Pending Prescriptions Disp Refills   citalopram  (CELEXA ) 20 MG tablet [Pharmacy Med Name: CITALOPRAM  20MG      TAB] 90 tablet 1    Sig: TAKE 1 TABLET BY MOUTH ONCE DAILY (TAKE WITH 60MG  DOSE)     Psychiatry:  Antidepressants - SSRI Passed - 10/06/2023  1:36 PM      Passed - Valid encounter within last 6 months    Recent Outpatient Visits           Yesterday Obsessive-compulsive disorder, unspecified type   [facility] Melvin Pao, NP   5 months ago Annual physical exam   [place] [facility] Melvin Pao, NP       Future Appointments             In 5 days [place], Penne SAUNDERS, MD [facility] [place]

## 2023-10-09 DIAGNOSIS — R3129 Other microscopic hematuria: Secondary | ICD-10-CM | POA: Insufficient documentation

## 2023-10-09 NOTE — Progress Notes (Unsigned)
 10/11/23 11:40 AM   Ralph Lawson 1975-03-12 [id number]  CC: microhematuria   HPI: 48 year old male here for initial evaluation of AMH UA (09/2023) - trace blood on dip, negative on micro UAs (2022 and 2023) - isolated 3-10 RBC No history of GH, nephrolithiasis, prostatitis, UTIs No change in urinary habits off baseline, no acute LUTS  Fhx+ -bladder cancer in father Current smoker-34-pack-year+  CT A/P (2023)-negative, no delays  Denies prior GU conditions, GU surgeries    PMH: Past Medical History:  Diagnosis Date   Anxiety    OCD (obsessive compulsive disorder)     Surgical History: Past Surgical History:  Procedure Laterality Date   collapsed lung     COLONOSCOPY WITH PROPOFOL  N/A 03/15/2021   Procedure: COLONOSCOPY WITH PROPOFOL ;  Surgeon: Therisa Bi, MD;  Location: [facility] ENDOSCOPY;  Service: Gastroenterology;  Laterality: N/A;   EYE SURGERY     R K Surgery    Family History: Family History  Problem Relation Age of Onset   Dementia Mother    Cancer Mother        Breast   Cancer Father        Skin   Bladder Cancer Father    Autism Son    Dementia Maternal Grandmother    Cancer Maternal Grandfather        Colon   Alzheimer's disease Maternal Grandfather    Alcohol abuse Maternal Grandfather    Hypertension Paternal Grandmother    Hypertension Paternal Grandfather    Diabetes Paternal Grandfather     Social History:  reports that he quit smoking about 2 years ago. His smoking use included cigarettes. He started smoking about 33 years ago. He has a 31.4 pack-year smoking history. He has never used smokeless tobacco. He reports current alcohol use of about 3.0 standard drinks of alcohol per week. He reports current drug use. Drug: Marijuana.      Physical Exam: BP (!) 140/78   Pulse 100   Ht 6' 1.5 (1.867 m)   Wt 202 lb (91.6 kg)   SpO2 95%   BMI 26.29 kg/m    Constitutional:  Alert and oriented, No acute distress. Cardiovascular: No clubbing,  cyanosis, or edema. Respiratory: Normal respiratory effort, no increased work of breathing. GI: Nondistended Skin: No rashes, bruises or suspicious lesions. Neurologic: Grossly intact, no focal deficits, moving all 4 extremities. Psychiatric: Normal mood and affect.  Laboratory Data: UA (09/2023) - trace blood on dip, negative on micro UAs (2022 and 2023) - isolated 3-10 RBC   Pertinent Imaging: CT A/P (2023)-negative, no delays    Assessment & Plan:    Microhematuria Assessment & Plan: 34-pack year+ current smoker  Asymptomatic microhematuria (AMH) is defined by the AUA as >=3 RBC/HPF on a single properly collected urinalysis, excluding benign causes. Current AUA (2020, amended 2025) recommend risk stratification into low, intermediate, and high groups based on age, sex, smoking history, degree of hematuria, and other urothelial cancer risk factors. This patient classifies as intermediate-risk AMH. Workup for [facility] is directed by risk stratification and shared patient decision making, tailored to balance early detection of urothelial malignancy with avoidance of necessary or invasive testing.    Plan: CT Urogram  and office cystoscopy  - 5mg  PO pre-proc Valium ordered  Orders: -     CT ABDOMEN PELVIS W CONTRAST; Future  Other orders -     diazePAM; Take 1 tablet by mouth 20 minutes prior to your procedure  Dispense: 1 tablet; Refill: 0  Penne Skye, MD 10/11/2023  [facility] [address] [place], [place] [address] [phone]

## 2023-10-09 NOTE — Assessment & Plan Note (Signed)
 Asymptomatic microhematuria (AMH) is defined by the AUA as >=3 RBC/HPF on a single properly collected urinalysis, excluding benign causes. Current AUA (2020, amended 2025) recommend risk stratification into low, intermediate, and high groups based on age, sex, smoking history, degree of hematuria, and other urothelial cancer risk factors. This patient classifies as intermediate-risk AMH. Workup for [facility] is directed by risk stratification and shared patient decision making, tailored to balance early detection of urothelial malignancy with avoidance of necessary or invasive testing.    Plan: CT Urogram  and office cystoscopy

## 2023-10-11 ENCOUNTER — Other Ambulatory Visit
Admission: RE | Admit: 2023-10-11 | Discharge: 2023-10-11 | Disposition: A | Discharge status: Home / Self Care | Attending: Urology | Admitting: Urology

## 2023-10-11 ENCOUNTER — Other Ambulatory Visit: Payer: Self-pay

## 2023-10-11 ENCOUNTER — Ambulatory Visit: Admitting: Urology

## 2023-10-11 VITALS — BP 140/78 | HR 100 | Ht 73.5 in | Wt 202.0 lb

## 2023-10-11 DIAGNOSIS — R310 Gross hematuria: Secondary | ICD-10-CM

## 2023-10-11 DIAGNOSIS — R3129 Other microscopic hematuria: Secondary | ICD-10-CM

## 2023-10-11 LAB — URINALYSIS, COMPLETE (UACMP) WITH MICROSCOPIC
Bilirubin Urine: NEGATIVE
Glucose, UA: NEGATIVE mg/dL
Ketones, ur: NEGATIVE mg/dL
Leukocytes,Ua: NEGATIVE
Nitrite: NEGATIVE
Protein, ur: NEGATIVE mg/dL
Specific Gravity, Urine: 1.02 (ref 1.005–1.030)
Squamous Epithelial / HPF: NONE SEEN /HPF (ref 0–5)
WBC, UA: NONE SEEN WBC/hpf (ref 0–5)
pH: 6 (ref 5.0–8.0)

## 2023-10-11 MED ORDER — DIAZEPAM 5 MG PO TABS: ORAL_TABLET | ORAL | 0 refills | Status: DC

## 2023-10-11 NOTE — Patient Instructions (Signed)
CT scheduling [phone]

## 2023-10-12 ENCOUNTER — Telehealth: Payer: Self-pay | Admitting: Nurse Practitioner

## 2023-10-12 NOTE — Telephone Encounter (Signed)
 No concerns with that, please proceed.

## 2023-10-12 NOTE — Telephone Encounter (Signed)
 Copied from CRM [id number]. Topic: Appointments - Scheduling Inquiry for Clinic >> Oct 12, 2023  3:49 PM Emylou G wrote: Reason for CRM: Patient called.. wants to know if he can combine both appts 10/9 &10/28 Keeping the 10/28th one?  Pls call patient

## 2023-10-13 NOTE — Telephone Encounter (Signed)
 Contacted patient and advised that appointments can be combined for a visit on 11/07/23. Patient acknowledged understanding.

## 2023-10-18 ENCOUNTER — Ambulatory Visit
Admission: RE | Admit: 2023-10-18 | Discharge: 2023-10-18 | Disposition: A | Discharge status: Home / Self Care | Source: Ambulatory Visit | Attending: Urology | Admitting: Urology

## 2023-10-18 DIAGNOSIS — R3129 Other microscopic hematuria: Secondary | ICD-10-CM | POA: Diagnosis present

## 2023-10-18 MED ORDER — IOHEXOL 300 MG/ML  SOLN
125.0000 mL | Freq: Once | INTRAMUSCULAR | Status: AC | PRN
  Administered 2023-10-18: 125 mL via INTRAVENOUS

## 2023-10-18 MED ORDER — SODIUM CHLORIDE 0.9 % IV SOLN: INTRAVENOUS | Status: DC

## 2023-10-19 ENCOUNTER — Ambulatory Visit: Admitting: Nurse Practitioner

## 2023-10-25 NOTE — Progress Notes (Signed)
   10/30/2023 2:51 PM   Ralph Lawson 02/24/1975 [id number]  Cystoscopy Procedure Note:  Indication:  High risk AMH Fhx+ -bladder cancer in father Current smoker-34-pack-year+  After informed consent and discussion of the procedure and its risks, Ralph Lawson was positioned and prepped in the standard fashion. Cystoscopy was performed with a flexible cystoscope. The urethra, bladder neck and bladder mucosa were visualized in a systematic fashion. The ureteral orifices were noted in orthotopic location and orientation. There were no bladder mucosal lesions, stones, debris or anatomic variants noted. The prostate gland was bilobar with mild occlusive hypertrophy.  There was some very mild urethritis along the bulbar urethra.   Imaging: Recent CTU  (10/19/23)reviewed - no GU abnormalities noted  Findings: Normal male cystoscopy, no lesions Mild bulbar urethritis  Assessment and Plan: Negative workup for microhematuria Intermittent dysuria with mild evidence of bulbar urethritis -benign, possible source of AMH Recommend 10-14-day course of NSAIDs for urethritis.  He is married and long-term monogamous relationship No further workup required for Ralph Lawson  Ralph Skye, MD 10/25/2023

## 2023-10-30 ENCOUNTER — Encounter: Payer: Self-pay | Admitting: Urology

## 2023-10-30 ENCOUNTER — Ambulatory Visit (INDEPENDENT_AMBULATORY_CARE_PROVIDER_SITE_OTHER): Admitting: Urology

## 2023-10-30 VITALS — BP 129/87 | HR 94 | Ht 73.0 in | Wt 200.0 lb

## 2023-10-30 DIAGNOSIS — R310 Gross hematuria: Secondary | ICD-10-CM

## 2023-10-30 LAB — MICROSCOPIC EXAMINATION: Bacteria, UA: NONE SEEN

## 2023-10-30 LAB — URINALYSIS, COMPLETE
Bilirubin, UA: NEGATIVE
Glucose, UA: NEGATIVE
Ketones, UA: NEGATIVE
Leukocytes,UA: NEGATIVE
Nitrite, UA: NEGATIVE
Protein,UA: NEGATIVE
Specific Gravity, UA: 1.01 (ref 1.005–1.030)
Urobilinogen, Ur: 0.2 mg/dL (ref 0.2–1.0)
pH, UA: 7 (ref 5.0–7.5)

## 2023-11-07 ENCOUNTER — Encounter: Payer: Self-pay | Admitting: Nurse Practitioner

## 2023-11-07 ENCOUNTER — Telehealth: Payer: Self-pay

## 2023-11-07 ENCOUNTER — Ambulatory Visit: Admitting: Nurse Practitioner

## 2023-11-07 VITALS — BP 124/80 | HR 96 | Temp 98.6°F | Ht 73.4 in | Wt 202.2 lb

## 2023-11-07 DIAGNOSIS — J189 Pneumonia, unspecified organism: Secondary | ICD-10-CM

## 2023-11-07 DIAGNOSIS — I700 Atherosclerosis of aorta: Secondary | ICD-10-CM | POA: Diagnosis not present

## 2023-11-07 DIAGNOSIS — F429 Obsessive-compulsive disorder, unspecified: Secondary | ICD-10-CM | POA: Diagnosis not present

## 2023-11-07 DIAGNOSIS — Z113 Encounter for screening for infections with a predominantly sexual mode of transmission: Secondary | ICD-10-CM | POA: Diagnosis not present

## 2023-11-07 DIAGNOSIS — Z1211 Encounter for screening for malignant neoplasm of colon: Secondary | ICD-10-CM

## 2023-11-07 MED ORDER — ROSUVASTATIN CALCIUM 5 MG PO TABS: 5.0000 mg | ORAL_TABLET | Freq: Every day | ORAL | 1 refills | Status: AC

## 2023-11-07 MED ORDER — ALBUTEROL SULFATE HFA 108 (90 BASE) MCG/ACT IN AERS
2.0000 | INHALATION_SPRAY | Freq: Four times a day (QID) | RESPIRATORY_TRACT | 2 refills | Status: AC | PRN
Start: 2023-11-07 — End: ?

## 2023-11-07 NOTE — Telephone Encounter (Signed)
 Aortic Atherosclerosis is not measured in stages.

## 2023-11-07 NOTE — Progress Notes (Signed)
 BP 124/80   Pulse 96   Temp 98.6 F (37 C) (Oral)   Ht 6' 1.4 (1.864 m)   Wt 202 lb 3.2 oz (91.7 kg)   SpO2 98%   BMI 26.39 kg/m    Subjective:    Patient ID: Ralph Lawson, male    DOB: 1975-06-02, 48 y.o.   MRN: [id number]  HPI: Ralph Lawson is a 48 y.o. male  Chief Complaint  Patient presents with   OCD    1 month f/up   MOOD Patient states he feels like his mood has been worse.  He didn't increase the dose of the Citalopram  to 60mg .  States he feels like his anxiety is still up.  Feels like he has probably increased drinking in the last week or so.  He plans to decrease his dose of drinking.   He is still taking the Citalopram  40mg .  Feels like he is getting more aggravated when he shouldn't be.      Relevant past medical, surgical, family and social history reviewed and updated as indicated. Interim medical history since our last visit reviewed. Allergies and medications reviewed and updated.  Review of Systems  Psychiatric/Behavioral:  Positive for dysphoric mood. The patient is nervous/anxious.     Per HPI unless specifically indicated above     Objective:    BP 124/80   Pulse 96   Temp 98.6 F (37 C) (Oral)   Ht 6' 1.4 (1.864 m)   Wt 202 lb 3.2 oz (91.7 kg)   SpO2 98%   BMI 26.39 kg/m   Wt Readings from Last 3 Encounters:  11/07/23 202 lb 3.2 oz (91.7 kg)  10/30/23 200 lb (90.7 kg)  10/11/23 202 lb (91.6 kg)    Physical Exam Vitals and nursing note reviewed.  Constitutional:      General: He is not in acute distress.    Appearance: Normal appearance. He is not ill-appearing, toxic-appearing or diaphoretic.  HENT:     Head: Normocephalic.     Right Ear: External ear normal.     Left Ear: External ear normal.     Nose: Nose normal. No congestion or rhinorrhea.     Mouth/Throat:     Mouth: Mucous membranes are moist.  Eyes:     General:        Right eye: No discharge.        Left eye: No discharge.     Extraocular Movements: Extraocular  movements intact.     Conjunctiva/sclera: Conjunctivae normal.     Pupils: Pupils are equal, round, and reactive to light.  Cardiovascular:     Rate and Rhythm: Normal rate and regular rhythm.     Heart sounds: No murmur heard. Pulmonary:     Effort: Pulmonary effort is normal. No respiratory distress.     Breath sounds: Normal breath sounds. No wheezing, rhonchi or rales.  Musculoskeletal:     Cervical back: Normal range of motion and neck supple.  Skin:    General: Skin is warm and dry.     Capillary Refill: Capillary refill takes less than 2 seconds.  Neurological:     General: No focal deficit present.     Mental Status: He is alert and oriented to person, place, and time.  Psychiatric:        Mood and Affect: Mood normal.        Behavior: Behavior normal.        Thought Content: Thought content normal.  Judgment: Judgment normal.     Results for orders placed or performed in visit on 10/30/23  Microscopic Examination   Collection Time: 10/30/23 10:27 AM   Urine  Result Value Ref Range   WBC, UA 0-5 0 - 5 /hpf   RBC, Urine 0-2 0 - 2 /hpf   Epithelial Cells (non renal) 0-10 0 - 10 /hpf   Bacteria, UA None seen None seen/Few  Urinalysis, Complete   Collection Time: 10/30/23 10:27 AM  Result Value Ref Range   Specific Gravity, UA 1.010 1.005 - 1.030   pH, UA 7.0 5.0 - 7.5   Color, UA Yellow Yellow   Appearance Ur Clear Clear   Leukocytes,UA Negative Negative   Protein,UA Negative Negative/Trace   Glucose, UA Negative Negative   Ketones, UA Negative Negative   RBC, UA Trace (A) Negative   Bilirubin, UA Negative Negative   Urobilinogen, Ur 0.2 0.2 - 1.0 mg/dL   Nitrite, UA Negative Negative   Microscopic Examination See below:       Assessment & Plan:   Problem List Items Addressed This Visit       Cardiovascular and Mediastinum   Aortic atherosclerosis   Observed on CT October 2025.  Will start Rosuvastatin  5mg  daily.  Discussed side effects and  benefits of medication.  Will recheck lipids at next visit.      Relevant Medications   rosuvastatin  (CRESTOR ) 5 MG tablet     Other   OCD (obsessive compulsive disorder) - Primary   Chronic. Not well controlled.  Patient would like to continue with Citalopram  40mg  instead of increasing to 60mg .  Follow up in 3 months.  Call sooner if concerns arise.      Other Visit Diagnoses       Community acquired pneumonia, unspecified laterality       Relevant Medications   albuterol  (VENTOLIN  HFA) 108 (90 Base) MCG/ACT inhaler     Screening examination for STI       Relevant Orders   HIV Antibody (routine testing w rflx)   RPR   Hepatitis C antibody   Chlamydia/Gonococcus/Trichomonas, NAA     Screening for colon cancer       Relevant Orders   Ambulatory referral to Gastroenterology        Follow up plan: Return in about 3 months (around 02/07/2024) for HTN, HLD, DM2 FU.

## 2023-11-07 NOTE — Telephone Encounter (Signed)
 Called and LVM letting patient know Karen's message.

## 2023-11-07 NOTE — Assessment & Plan Note (Signed)
 Chronic. Not well controlled.  Patient would like to continue with Citalopram  40mg  instead of increasing to 60mg .  Follow up in 3 months.  Call sooner if concerns arise.

## 2023-11-07 NOTE — Assessment & Plan Note (Signed)
 Observed on CT October 2025.  Will start Rosuvastatin  5mg  daily.  Discussed side effects and benefits of medication.  Will recheck lipids at next visit.

## 2023-11-07 NOTE — Telephone Encounter (Signed)
 Copied from CRM [id number]. Topic: Clinical - Medical Advice >> Nov 07, 2023 11:20 AM Winona R wrote: Pt would like to know what statge hes in of Aortic atherosclerosis.

## 2023-11-08 ENCOUNTER — Other Ambulatory Visit: Payer: Self-pay

## 2023-11-08 ENCOUNTER — Telehealth: Payer: Self-pay

## 2023-11-08 DIAGNOSIS — Z800 Family history of malignant neoplasm of digestive organs: Secondary | ICD-10-CM

## 2023-11-08 DIAGNOSIS — Z860100 Personal history of colon polyps, unspecified: Secondary | ICD-10-CM

## 2023-11-08 LAB — HEPATITIS C ANTIBODY: Hep C Virus Ab: NONREACTIVE

## 2023-11-08 LAB — RPR: RPR Ser Ql: NONREACTIVE

## 2023-11-08 LAB — HIV ANTIBODY (ROUTINE TESTING W REFLEX): HIV Screen 4th Generation wRfx: NONREACTIVE

## 2023-11-08 MED ORDER — NA SULFATE-K SULFATE-MG SULF 17.5-3.13-1.6 GM/177ML PO SOLN: 1.0000 | Freq: Once | ORAL | 0 refills | Status: AC

## 2023-11-08 NOTE — Telephone Encounter (Signed)
 Gastroenterology Pre-Procedure Review  Request Date: 01/10/24 Requesting Physician: Dr. melany  PATIENT REVIEW QUESTIONS: The patient responded to the following health history questions as indicated:    1. Are you having any GI issues? no 2. Do you have a personal history of Polyps? yes (last colonoscopy was 03/15/21 recommended to repeat in 3 years.  Pt was made aware and requested to keep colonoscopy as scheduled for 01/10/24) 3. Do you have a family history of Colon Cancer or Polyps? yes (maternal grandfather colon cancer) 4. Diabetes Mellitus? no 5. Joint replacements in the past 12 months?no 6. Major health problems in the past 3 months?no 7. Any artificial heart valves, MVP, or defibrillator?no    MEDICATIONS & ALLERGIES:    Patient reports the following regarding taking any anticoagulation/antiplatelet therapy:   Plavix, Coumadin, Eliquis, Xarelto, Lovenox, Pradaxa, Brilinta, or Effient? no Aspirin? no  Patient confirms/reports the following medications:  Current Outpatient Medications  Medication Sig Dispense Refill   Na Sulfate-K Sulfate-Mg Sulfate concentrate (SUPREP) 17.5-3.13-1.6 GM/177ML SOLN Take 1 kit (354 mLs total) by mouth once for 1 dose. 354 mL 0   albuterol  (VENTOLIN  HFA) 108 (90 Base) MCG/ACT inhaler Inhale 2 puffs into the lungs every 6 (six) hours as needed for wheezing or shortness of breath. 8 g 2   cetirizine (ZYRTEC) 10 MG tablet Take 10 mg by mouth daily.     citalopram  (CELEXA ) 40 MG tablet Take 40 mg by mouth daily.     rosuvastatin  (CRESTOR ) 5 MG tablet Take 1 tablet (5 mg total) by mouth daily. 90 tablet 1   sildenafil  (VIAGRA ) 50 MG tablet Take 1 tablet (50 mg total) by mouth daily as needed for erectile dysfunction. 10 tablet 3   No current facility-administered medications for this visit.    Patient confirms/reports the following allergies:  No Known Allergies  No orders of the defined types were placed in this encounter.   AUTHORIZATION  INFORMATION Primary Insurance: 1D#: Group #:  Secondary Insurance: 1D#: Group #:  SCHEDULE INFORMATION: Date: 01/10/24 Time: Location: [facility]

## 2023-11-09 ENCOUNTER — Ambulatory Visit: Payer: Self-pay | Admitting: Nurse Practitioner

## 2023-11-09 LAB — CHLAMYDIA/GONOCOCCUS/TRICHOMONAS, NAA
Chlamydia by NAA: NEGATIVE
Gonococcus by NAA: NEGATIVE
Trich vag by NAA: NEGATIVE

## 2023-11-17 ENCOUNTER — Telehealth: Payer: Self-pay | Admitting: Nurse Practitioner

## 2023-11-17 DIAGNOSIS — I700 Atherosclerosis of aorta: Secondary | ICD-10-CM

## 2023-11-17 NOTE — Telephone Encounter (Signed)
 Can this referral be entered for the patient?

## 2023-11-17 NOTE — Telephone Encounter (Signed)
 No problem for the referral but I need to put a reason. Can we ask him why he wants to see them?

## 2023-11-17 NOTE — Telephone Encounter (Signed)
 Copied from CRM [id number]. Topic: Referral - Request for Referral >> Nov 17, 2023  9:38 AM Larissa RAMAN wrote: Did the patient discuss referral with their provider in the last year? Yes (If No - schedule appointment) (If Yes - send message)  Appointment offered?   Type of order/referral and detailed reason for visit: Cardiologist   Preference of office, provider, location: [place], [place] or [place], [place] area   If referral order, have you been seen by this specialty before? No (If Yes, this issue or another issue? When? Where?  Can we respond through MyChart? No, prefers phone call

## 2023-11-20 NOTE — Telephone Encounter (Signed)
 Referral placed.

## 2023-11-20 NOTE — Telephone Encounter (Signed)
 Called and spoke to patient. He states he would like to see cardiology for the diagnosis of aortic atherosclerosis.

## 2024-01-01 ENCOUNTER — Encounter: Payer: Self-pay | Admitting: Gastroenterology

## 2024-01-01 NOTE — Anesthesia Preprocedure Evaluation (Addendum)
"                                    Anesthesia Evaluation  Patient identified by MRN, date of birth, ID band Patient awake    Reviewed: Allergy & Precautions, H&P , NPO status , Patient's Chart, lab work & pertinent test results  Airway Mallampati: I  TM Distance: >3 FB Neck ROM: Full    Dental no notable dental hx. (+) Teeth Intact   Pulmonary neg pulmonary ROS, former smoker   Pulmonary exam normal breath sounds clear to auscultation       Cardiovascular + CAD  negative cardio ROS Normal cardiovascular exam Rhythm:Regular Rate:Normal     Neuro/Psych  PSYCHIATRIC DISORDERS Anxiety     negative neurological ROS  negative psych ROS   GI/Hepatic negative GI ROS, Neg liver ROS,,,  Endo/Other  negative endocrine ROS    Renal/GU Renal diseasenegative Renal ROS  negative genitourinary   Musculoskeletal negative musculoskeletal ROS (+) Arthritis ,    Abdominal   Peds negative pediatric ROS (+)  Hematology negative hematology ROS (+)   Anesthesia Other Findings OCD (obsessive compulsive disorder)  Anxiety Aortic atherosclerosis  Coronary artery disease Arthritis  Renal cyst, left Pneumothorax Chronic bilateral low back pain without sciatica Atherosclerosis of aorta      Reproductive/Obstetrics negative OB ROS                              Anesthesia Physical Anesthesia Plan  ASA: 3  Anesthesia Plan: General   Post-op Pain Management:    Induction: Intravenous  PONV Risk Score and Plan:   Airway Management Planned: Natural Airway and Nasal Cannula  Additional Equipment:   Intra-op Plan:   Post-operative Plan:   Informed Consent: I have reviewed the patients History and Physical, chart, labs and discussed the procedure including the risks, benefits and alternatives for the proposed anesthesia with the patient or authorized representative who has indicated his/her understanding and acceptance.     Dental  Advisory Given  Plan Discussed with: Anesthesiologist, CRNA and Surgeon  Anesthesia Plan Comments: (Patient consented for risks of anesthesia including but not limited to:  - adverse reactions to medications - risk of airway placement if required - damage to eyes, teeth, lips or other oral mucosa - nerve damage due to positioning  - sore throat or hoarseness - Damage to heart, brain, nerves, lungs, other parts of body or loss of life  Patient voiced understanding and assent.)         Anesthesia Quick Evaluation  "
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
## 2024-01-08 ENCOUNTER — Encounter: Payer: Self-pay | Admitting: Gastroenterology

## 2024-01-09 ENCOUNTER — Encounter: Payer: Self-pay | Admitting: Cardiovascular Disease

## 2024-01-09 ENCOUNTER — Ambulatory Visit: Attending: Cardiovascular Disease | Admitting: Cardiovascular Disease

## 2024-01-09 VITALS — BP 110/70 | HR 69 | Ht 73.5 in | Wt 199.5 lb

## 2024-01-09 DIAGNOSIS — R0602 Shortness of breath: Secondary | ICD-10-CM | POA: Insufficient documentation

## 2024-01-09 DIAGNOSIS — Z720 Tobacco use: Secondary | ICD-10-CM | POA: Diagnosis not present

## 2024-01-09 DIAGNOSIS — I700 Atherosclerosis of aorta: Secondary | ICD-10-CM | POA: Insufficient documentation

## 2024-01-09 DIAGNOSIS — R079 Chest pain, unspecified: Secondary | ICD-10-CM | POA: Insufficient documentation

## 2024-01-09 DIAGNOSIS — E782 Mixed hyperlipidemia: Secondary | ICD-10-CM | POA: Diagnosis not present

## 2024-01-09 NOTE — Patient Instructions (Signed)
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
## 2024-01-10 ENCOUNTER — Encounter: Payer: Self-pay | Admitting: Gastroenterology

## 2024-01-10 ENCOUNTER — Ambulatory Visit
Admission: RE | Admit: 2024-01-10 | Discharge: 2024-01-10 | Disposition: A | Discharge status: Home / Self Care | Source: Home / Self Care | Attending: Gastroenterology | Admitting: Gastroenterology

## 2024-01-10 ENCOUNTER — Ambulatory Visit: Payer: Self-pay | Admitting: Anesthesiology

## 2024-01-10 ENCOUNTER — Encounter
Admission: RE | Disposition: A | Discharge status: Home / Self Care | Payer: Self-pay | Source: Home / Self Care | Attending: Gastroenterology

## 2024-01-10 ENCOUNTER — Other Ambulatory Visit: Payer: Self-pay

## 2024-01-10 DIAGNOSIS — F419 Anxiety disorder, unspecified: Secondary | ICD-10-CM | POA: Diagnosis not present

## 2024-01-10 DIAGNOSIS — Z1211 Encounter for screening for malignant neoplasm of colon: Secondary | ICD-10-CM | POA: Diagnosis present

## 2024-01-10 DIAGNOSIS — I2510 Atherosclerotic heart disease of native coronary artery without angina pectoris: Secondary | ICD-10-CM | POA: Insufficient documentation

## 2024-01-10 DIAGNOSIS — Z860100 Personal history of colon polyps, unspecified: Secondary | ICD-10-CM

## 2024-01-10 DIAGNOSIS — Z87891 Personal history of nicotine dependence: Secondary | ICD-10-CM | POA: Insufficient documentation

## 2024-01-10 DIAGNOSIS — K635 Polyp of colon: Secondary | ICD-10-CM | POA: Insufficient documentation

## 2024-01-10 DIAGNOSIS — Z860101 Personal history of adenomatous and serrated colon polyps: Secondary | ICD-10-CM | POA: Diagnosis not present

## 2024-01-10 DIAGNOSIS — Z800 Family history of malignant neoplasm of digestive organs: Secondary | ICD-10-CM

## 2024-01-10 DIAGNOSIS — M1990 Unspecified osteoarthritis, unspecified site: Secondary | ICD-10-CM | POA: Insufficient documentation

## 2024-01-10 DIAGNOSIS — D125 Benign neoplasm of sigmoid colon: Secondary | ICD-10-CM | POA: Diagnosis not present

## 2024-01-10 DIAGNOSIS — Z7951 Long term (current) use of inhaled steroids: Secondary | ICD-10-CM | POA: Diagnosis not present

## 2024-01-10 DIAGNOSIS — K640 First degree hemorrhoids: Secondary | ICD-10-CM | POA: Diagnosis not present

## 2024-01-10 DIAGNOSIS — Z79899 Other long term (current) drug therapy: Secondary | ICD-10-CM | POA: Diagnosis not present

## 2024-01-10 HISTORY — DX: Low back pain, unspecified: M54.50

## 2024-01-10 HISTORY — DX: Atherosclerotic heart disease of native coronary artery without angina pectoris: I25.10

## 2024-01-10 HISTORY — DX: Other chronic pain: G89.29

## 2024-01-10 HISTORY — PX: COLONOSCOPY: SHX5424

## 2024-01-10 HISTORY — DX: Pneumothorax, unspecified: J93.9

## 2024-01-10 HISTORY — PX: POLYPECTOMY: SHX149

## 2024-01-10 HISTORY — DX: Cyst of kidney, acquired: N28.1

## 2024-01-10 HISTORY — DX: Unspecified osteoarthritis, unspecified site: M19.90

## 2024-01-10 SURGERY — COLONOSCOPY
Anesthesia: General | Site: Rectum

## 2024-01-10 MED ORDER — PROPOFOL 1000 MG/100ML IV EMUL
INTRAVENOUS | Status: AC
  Filled 2024-01-10: qty 100

## 2024-01-10 MED ORDER — SODIUM CHLORIDE 0.9 % IV SOLN: INTRAVENOUS | Status: DC

## 2024-01-10 MED ORDER — STERILE WATER FOR IRRIGATION IR SOLN
Status: DC | PRN
  Administered 2024-01-10: 250 mL

## 2024-01-10 MED ORDER — LIDOCAINE HCL (PF) 2 % IJ SOLN
INTRAMUSCULAR | Status: AC
  Filled 2024-01-10: qty 10

## 2024-01-10 MED ORDER — PROPOFOL 10 MG/ML IV BOLUS
INTRAVENOUS | Status: DC | PRN
  Administered 2024-01-10 (×3): 50 mg via INTRAVENOUS
  Administered 2024-01-10 (×2): 100 mg via INTRAVENOUS
  Administered 2024-01-10: 50 mg via INTRAVENOUS

## 2024-01-10 MED ORDER — LIDOCAINE HCL (CARDIAC) PF 100 MG/5ML IV SOSY
PREFILLED_SYRINGE | INTRAVENOUS | Status: DC | PRN
  Administered 2024-01-10: 80 mg via INTRAVENOUS

## 2024-01-10 MED ORDER — LACTATED RINGERS IV SOLN: INTRAVENOUS | Status: DC

## 2024-01-10 MED ORDER — STERILE WATER FOR IRRIGATION IR SOLN
Status: DC | PRN
  Administered 2024-01-10: 60 mL

## 2024-01-10 SURGICAL SUPPLY — 19 items
CLIP HMST 235XBRD CATH ROT (MISCELLANEOUS) IMPLANT
ELECTRODE REM PT RTRN 9FT ADLT (ELECTROSURGICAL) IMPLANT
FORCEPS BIOP RAD 4 LRG CAP 4 (CUTTING FORCEPS) IMPLANT
GAUZE SPONGE 4X4 12PLY STRL (GAUZE/BANDAGES/DRESSINGS) IMPLANT
GOWN CVR UNV OPN BCK APRN NK (MISCELLANEOUS) ×4 IMPLANT
INJECTOR VARIJECT VIN23 (MISCELLANEOUS) IMPLANT
KIT DEFENDO VALVE AND CONN (KITS) IMPLANT
KIT PROCEDURE OLYMPUS (MISCELLANEOUS) ×2 IMPLANT
MANIFOLD NEPTUNE II (INSTRUMENTS) ×2 IMPLANT
MARKER SPOT ENDO TATTOO 5ML (MISCELLANEOUS) IMPLANT
PROBE APC STR FIRE (PROBE) IMPLANT
RETRIEVER NET ROTH 2.5X230 LF (MISCELLANEOUS) IMPLANT
SNARE COLD EXACTO (MISCELLANEOUS) IMPLANT
SNARE LASSO HEX 3 IN 1 (INSTRUMENTS) IMPLANT
SNARE SHORT THROW 13M SML OVAL (MISCELLANEOUS) IMPLANT
SNARE SNG USE RND 15MM (INSTRUMENTS) IMPLANT
SYR 50ML SLIP (SYRINGE) IMPLANT
TRAP ETRAP POLY (MISCELLANEOUS) IMPLANT
WATER STERILE IRR 250ML POUR (IV SOLUTION) ×2 IMPLANT

## 2024-01-10 NOTE — Op Note (Signed)
 [facility] Gastroenterology Patient Name: Ralph Lawson Procedure Date: 01/10/2024 7:09 AM MRN: [id number] Account #: [id number] Date of Birth: 30-Aug-1975 Admit Type: Outpatient Age: 48 Room: [facility] OR ROOM 01 Gender: Male Note Status: Finalized Instrument Name: Colonoscope [id number] Procedure:             Colonoscopy Indications:           High risk colon cancer surveillance: Personal history                         of colonic polyps Providers:             Clotilda Schaffer, MD Referring MD:          Clotilda Schaffer, MD (Referring MD), Darice Petty                         (Referring MD) Medicines:             Propofol  per Anesthesia Complications:         No immediate complications. Procedure:             Pre-Anesthesia Assessment:                        - Prior to the procedure, a History and Physical was                         performed, and patient medications and allergies were                         reviewed. The patient's tolerance of previous                         anesthesia was also reviewed. The risks and benefits                         of the procedure and the sedation options and risks                         were discussed with the patient. All questions were                         answered, and informed consent was obtained. Prior                         Anticoagulants: The patient has taken no anticoagulant                         or antiplatelet agents. ASA Grade Assessment: II - A                         patient with mild systemic disease. After reviewing                         the risks and benefits, the patient was deemed in                         satisfactory condition to undergo the procedure.  After obtaining informed consent, the colonoscope was                         passed under direct vision. Throughout the procedure,                         the patient's blood pressure, pulse, and oxygen                          saturations were monitored continuously. The                         Colonoscope was introduced through the anus and                         advanced to the 10 cm into the ileum. The terminal                         ileum, ileocecal valve, appendiceal orifice, and                         rectum were photographed. Findings:      Two sessile polyps were found in the sigmoid colon and ascending colon.       The polyps were 3 to 4 mm in size. These polyps were removed with a cold       snare. Resection and retrieval were complete. Estimated blood loss: none.      Internal hemorrhoids were found during retroflexion. The hemorrhoids       were Grade I (internal hemorrhoids that do not prolapse).      The terminal ileum appeared normal. Impression:            - Two 3 to 4 mm polyps in the sigmoid colon and in the                         ascending colon, removed with a cold snare. Resected                         and retrieved.                        - Internal hemorrhoids.                        - The examined portion of the ileum was normal. Recommendation:        - Patient has a contact number available for                         emergencies. The signs and symptoms of potential                         delayed complications were discussed with the patient.                         Return to normal activities tomorrow. Written                         discharge instructions were provided to the patient.                        -  High fiber diet.                        - Continue present medications.                        - Await pathology results.                        - Repeat colonoscopy in 5 years.                        - The findings and recommendations were discussed with                         the designated responsible adult. Procedure Code(s):     --- Professional ---                        [phone], Colonoscopy, flexible; with removal of                         tumor(s), polyp(s), or  other lesion(s) by snare                         technique Diagnosis Code(s):     --- Professional ---                        Z86.010, Personal history of colonic polyps                        D12.5, Benign neoplasm of sigmoid colon                        D12.2, Benign neoplasm of ascending colon                        K64.0, First degree hemorrhoids CPT copyright 2022 American Medical Association. All rights reserved. The codes documented in this report are preliminary and upon coder review may  be revised to meet current compliance requirements. Clotilda Schaffer, MD 01/10/2024 8:02:23 AM Number of Addenda: 0 Note Initiated On: 01/10/2024 7:09 AM Scope Withdrawal Time: 0 hours 9 minutes 6 seconds  Total Procedure Duration: 0 hours 15 minutes 43 seconds  Estimated Blood Loss:  Estimated blood loss: none.      [facility]

## 2024-01-10 NOTE — Anesthesia Postprocedure Evaluation (Signed)
"   Anesthesia Post Note  Patient: Art Therapist  Procedure(s) Performed: COLONOSCOPY (Rectum) POLYPECTOMY, INTESTINE (Rectum)  Patient location during evaluation: PACU Anesthesia Type: General Level of consciousness: awake and alert Pain management: pain level controlled Vital Signs Assessment: post-procedure vital signs reviewed and stable Respiratory status: spontaneous breathing, nonlabored ventilation, respiratory function stable and patient connected to nasal cannula oxygen Cardiovascular status: blood pressure returned to baseline and stable Postop Assessment: no apparent nausea or vomiting Anesthetic complications: no   No notable events documented.   Last Vitals:  Vitals:   01/10/24 0805 01/10/24 0811  BP:  112/79  Pulse: 70 77  Resp: 16 20  Temp:    SpO2: 95% 99%    Last Pain:  Vitals:   01/10/24 0811  TempSrc:   PainSc: 0-No pain                 Diamone Whistler C Torra Pala      "

## 2024-01-10 NOTE — Transfer of Care (Signed)
 Immediate Anesthesia Transfer of Care Note  Patient: Ralph Lawson  Procedure(s) Performed: COLONOSCOPY (Rectum) POLYPECTOMY, INTESTINE (Rectum)  Patient Location: PACU  Anesthesia Type: General  Level of Consciousness: awake, alert  and patient cooperative  Airway and Oxygen Therapy: Patient Spontanous Breathing and Patient connected to supplemental oxygen  Post-op Assessment: Post-op Vital signs reviewed, Patient's Cardiovascular Status Stable, Respiratory Function Stable, Patent Airway and No signs of Nausea or vomiting  Post-op Vital Signs: Reviewed and stable  Complications: No notable events documented.

## 2024-01-12 LAB — SURGICAL PATHOLOGY

## 2024-01-15 ENCOUNTER — Ambulatory Visit: Payer: Self-pay | Admitting: Gastroenterology

## 2024-01-27 ENCOUNTER — Other Ambulatory Visit: Payer: Self-pay | Admitting: Nurse Practitioner

## 2024-01-29 NOTE — Telephone Encounter (Signed)
 Requested medication (s) are due for refill today - unsure  Requested medication (s) are on the active medication list -yes  Future visit scheduled -yes  Last refill: unsure  Notes to clinic: listed as historical medication   Requested Prescriptions  Pending Prescriptions Disp Refills   citalopram  (CELEXA ) 40 MG tablet [Pharmacy Med Name: Citalopram  Hydrobromide 40 MG Oral Tablet] 90 tablet 0    Sig: Take 1 tablet by mouth once daily     Psychiatry:  Antidepressants - SSRI Passed - 01/29/2024  2:01 PM      Passed - Valid encounter within last 6 months    Recent Outpatient Visits           2 months ago Obsessive-compulsive disorder, unspecified type   [place] [facility] Melvin Pao, NP   3 months ago Obsessive-compulsive disorder, unspecified type   [place] [facility] Melvin Pao, NP   9 months ago Annual physical exam   [place] [facility] Melvin Pao, NP                 Requested Prescriptions  Pending Prescriptions Disp Refills   citalopram  (CELEXA ) 40 MG tablet [Pharmacy Med Name: Citalopram  Hydrobromide 40 MG Oral Tablet] 90 tablet 0    Sig: Take 1 tablet by mouth once daily     Psychiatry:  Antidepressants - SSRI Passed - 01/29/2024  2:01 PM      Passed - Valid encounter within last 6 months    Recent Outpatient Visits           2 months ago Obsessive-compulsive disorder, unspecified type   [place] [facility] Melvin Pao, NP   3 months ago Obsessive-compulsive disorder, unspecified type   [place] [facility] Melvin Pao, NP   9 months ago Annual physical exam   [place] [facility] Melvin Pao, NP

## 2024-01-29 NOTE — Telephone Encounter (Signed)
 Called and spoke with patient. He states that he does need a refill before his upcoming appointment.

## 2024-02-07 ENCOUNTER — Encounter: Payer: Self-pay | Admitting: Nurse Practitioner

## 2024-02-07 ENCOUNTER — Ambulatory Visit (INDEPENDENT_AMBULATORY_CARE_PROVIDER_SITE_OTHER): Payer: Self-pay | Admitting: Nurse Practitioner

## 2024-02-07 VITALS — BP 127/79 | HR 87 | Resp 17 | Ht 73.5 in | Wt 197.8 lb

## 2024-02-07 DIAGNOSIS — F429 Obsessive-compulsive disorder, unspecified: Secondary | ICD-10-CM

## 2024-02-07 DIAGNOSIS — Z23 Encounter for immunization: Secondary | ICD-10-CM

## 2024-02-07 NOTE — Assessment & Plan Note (Signed)
 Chronic.  Controlled.  Continue with current medication regimen of Citalopram .  Return to clinic in 3 months for reevaluation.  Call sooner if concerns arise.

## 2024-02-07 NOTE — Progress Notes (Signed)
 "  BP 127/79 (BP Location: Left Arm, Patient Position: Sitting, Cuff Size: Normal)   Pulse 87   Resp 17   Ht 6' 1.5 (1.867 m)   Wt 197 lb 12.8 oz (89.7 kg)   SpO2 97%   BMI 25.74 kg/m    Subjective:    Patient ID: Ralph Lawson, male    DOB: [birth year], 49 y.o.   MRN: [id number]  HPI: Ralph Lawson is a 49 y.o. male  Chief Complaint  Patient presents with   Immunizations    Would like to get overdue vaccines.   Follow-up    Thinks he may need blood work    MOOD Patient states he feels like his mood has been better.  He feels like he is doing well with the Citalopram  40mg .  He feels like he is better controlled.  He has stopped drinking alcohol.  Which is why his mood has improved.  Denies SI.      Relevant past medical, surgical, family and social history reviewed and updated as indicated. Interim medical history since our last visit reviewed. Allergies and medications reviewed and updated.  Review of Systems  Psychiatric/Behavioral:  Positive for dysphoric mood. The patient is nervous/anxious.     Per HPI unless specifically indicated above     Objective:    BP 127/79 (BP Location: Left Arm, Patient Position: Sitting, Cuff Size: Normal)   Pulse 87   Resp 17   Ht 6' 1.5 (1.867 m)   Wt 197 lb 12.8 oz (89.7 kg)   SpO2 97%   BMI 25.74 kg/m   Wt Readings from Last 3 Encounters:  02/07/24 197 lb 12.8 oz (89.7 kg)  01/10/24 193 lb 6.4 oz (87.7 kg)  01/09/24 199 lb 8 oz (90.5 kg)    Physical Exam Vitals and nursing note reviewed.  Constitutional:      General: He is not in acute distress.    Appearance: Normal appearance. He is not ill-appearing, toxic-appearing or diaphoretic.  HENT:     Head: Normocephalic.     Right Ear: External ear normal.     Left Ear: External ear normal.     Nose: Nose normal. No congestion or rhinorrhea.     Mouth/Throat:     Mouth: Mucous membranes are moist.  Eyes:     General:        Right eye: No discharge.        Left eye: No  discharge.     Extraocular Movements: Extraocular movements intact.     Conjunctiva/sclera: Conjunctivae normal.     Pupils: Pupils are equal, round, and reactive to light.  Cardiovascular:     Rate and Rhythm: Normal rate and regular rhythm.     Heart sounds: No murmur heard. Pulmonary:     Effort: Pulmonary effort is normal. No respiratory distress.     Breath sounds: Normal breath sounds. No wheezing, rhonchi or rales.  Musculoskeletal:     Cervical back: Normal range of motion and neck supple.  Skin:    General: Skin is warm and dry.     Capillary Refill: Capillary refill takes less than 2 seconds.  Neurological:     General: No focal deficit present.     Mental Status: He is alert and oriented to person, place, and time.  Psychiatric:        Mood and Affect: Mood normal.        Behavior: Behavior normal.        Thought Content: Thought  content normal.        Judgment: Judgment normal.     Results for orders placed or performed during the hospital encounter of 01/10/24  Surgical pathology   Collection Time: 01/10/24 12:00 AM  Result Value Ref Range   SURGICAL PATHOLOGY      SURGICAL PATHOLOGY [facility] [address], [address] [place], [place] [address] Telephone [phone] or [phone] Fax [phone]  REPORT OF SURGICAL PATHOLOGY   Accession #: [id number] Patient Name: HASKEL, DEWALT Visit # : [id number]  MRN: [id number] Physician: Melany Kirsch DOB/Age [birth year] (Age: 51) Gender: M Collected Date: 01/10/2024 Received Date: 01/10/2024  FINAL DIAGNOSIS       1. Ascending  Colon Polyp,  :       - BENIGN COLONIC MUCOSA WITH SURFACE HYPERPLASTIC CHANGES.       2. Sigmoid  Colon Polyp,  :       - TUBULAR ADENOMA.       ELECTRONIC SIGNATURE : Coronel Md, Misti, Sports Administrator, International Aid/development Worker  MICROSCOPIC DESCRIPTION  CASE COMMENTS STAINS USED IN DIAGNOSIS: H&E H&E    CLINICAL HISTORY  SPECIMEN(S) OBTAINED 1.  Ascending  Colon Polyp, 2. Sigmoid  Colon Polyp,  SPECIMEN COMMENTS: 1. Family history of colon cancer, maternal grandfather colon cancer SPECIMEN CLINICAL INFORMATION: 1 . Polyp    Gross Description 1. Received in formalin is a 0.3 x 0.2 x 0.2 cm tan-pink soft tissue fragment, entirely submitted in one block. 2. Received in formalin is a 1.0 x 0.3 x 0.2 cm tan soft tissue fragment, entirely submitted in one block.(BPkh:01/12/24)        Report signed out from the following location(s) [place] [address] [place], [place] [address] CLIA #: [id number]  [facility] [address] [place], [place] [address] CLIA #: [id number]       Assessment & Plan:   Problem List Items Addressed This Visit       Other   OCD (obsessive compulsive disorder) - Primary   Chronic.  Controlled.  Continue with current medication regimen of Citalopram .  Return to clinic in 3 months for reevaluation.  Call sooner if concerns arise.        Other Visit Diagnoses       Need for hepatitis B vaccination       Relevant Orders   Heplisav-B (HepB-CPG) Vaccine         Follow up plan: Return in about 3 months (around 05/07/2024) for Physical and Fasting labs.      "

## 2024-05-07 ENCOUNTER — Encounter: Payer: Self-pay | Admitting: Nurse Practitioner
